# Patient Record
Sex: Male | Born: 1940
Health system: Southern US, Community
[De-identification: ages and names within clinical notes are randomized; demographics above are authoritative.]

## PROBLEM LIST (undated history)

## (undated) DIAGNOSIS — Z972 Presence of dental prosthetic device (complete) (partial): Secondary | ICD-10-CM

## (undated) DIAGNOSIS — E7800 Pure hypercholesterolemia, unspecified: Secondary | ICD-10-CM

## (undated) DIAGNOSIS — I2510 Atherosclerotic heart disease of native coronary artery without angina pectoris: Secondary | ICD-10-CM

## (undated) DIAGNOSIS — K219 Gastro-esophageal reflux disease without esophagitis: Secondary | ICD-10-CM

## (undated) DIAGNOSIS — T7840XA Allergy, unspecified, initial encounter: Secondary | ICD-10-CM

## (undated) DIAGNOSIS — I10 Essential (primary) hypertension: Secondary | ICD-10-CM

## (undated) DIAGNOSIS — N189 Chronic kidney disease, unspecified: Secondary | ICD-10-CM

## (undated) DIAGNOSIS — M1990 Unspecified osteoarthritis, unspecified site: Secondary | ICD-10-CM

## (undated) DIAGNOSIS — H269 Unspecified cataract: Secondary | ICD-10-CM

## (undated) DIAGNOSIS — I4892 Unspecified atrial flutter: Secondary | ICD-10-CM

## (undated) HISTORY — PX: COLONOSCOPY: SHX174

## (undated) HISTORY — DX: Allergy, unspecified, initial encounter: T78.40XA

## (undated) HISTORY — PX: OTHER SURGICAL HISTORY: SHX169

## (undated) HISTORY — DX: Unspecified osteoarthritis, unspecified site: M19.90

## (undated) HISTORY — PX: CARDIAC SURGERY: SHX584

## (undated) HISTORY — DX: Unspecified cataract: H26.9

## (undated) HISTORY — DX: Atherosclerotic heart disease of native coronary artery without angina pectoris: I25.10

## (undated) HISTORY — DX: Essential (primary) hypertension: I10

## (undated) HISTORY — PX: CATARACT EXTRACTION: SUR2

## (undated) HISTORY — DX: Unspecified atrial flutter: I48.92

## (undated) HISTORY — DX: Pure hypercholesterolemia, unspecified: E78.00

---

## 2005-06-19 ENCOUNTER — Ambulatory Visit (HOSPITAL_COMMUNITY): Admission: RE | Admit: 2005-06-19 | Discharge: 2005-06-19 | Payer: Self-pay | Admitting: Pediatrics

## 2005-07-02 ENCOUNTER — Inpatient Hospital Stay (HOSPITAL_COMMUNITY): Admission: RE | Admit: 2005-07-02 | Discharge: 2005-07-11 | Payer: Self-pay | Admitting: Surgery

## 2005-07-02 HISTORY — PX: CORONARY ARTERY BYPASS GRAFT: SHX141

## 2005-07-09 HISTORY — PX: CARDIOVERSION: SHX1299

## 2005-08-23 ENCOUNTER — Encounter (HOSPITAL_COMMUNITY): Admission: RE | Admit: 2005-08-23 | Discharge: 2005-11-21 | Payer: Self-pay | Admitting: Cardiology

## 2005-11-22 ENCOUNTER — Encounter (HOSPITAL_COMMUNITY): Admission: RE | Admit: 2005-11-22 | Discharge: 2006-02-20 | Payer: Self-pay | Admitting: Cardiology

## 2006-08-21 IMAGING — CR DG CHEST 2V
2 series · 2 of 2 positions shown · non-contrast
Comparison: none

CLINICAL DATA: Coronary artery disease.  Hypertension.   Prior smoker. Pre-op for CABG. 
 CHEST - 2 VIEW:
 The heart size and mediastinal contours are within normal limits.  Both lungs are clear.  The visualized skeletal structures are unremarkable.

[view not recorded (1 of 2)]
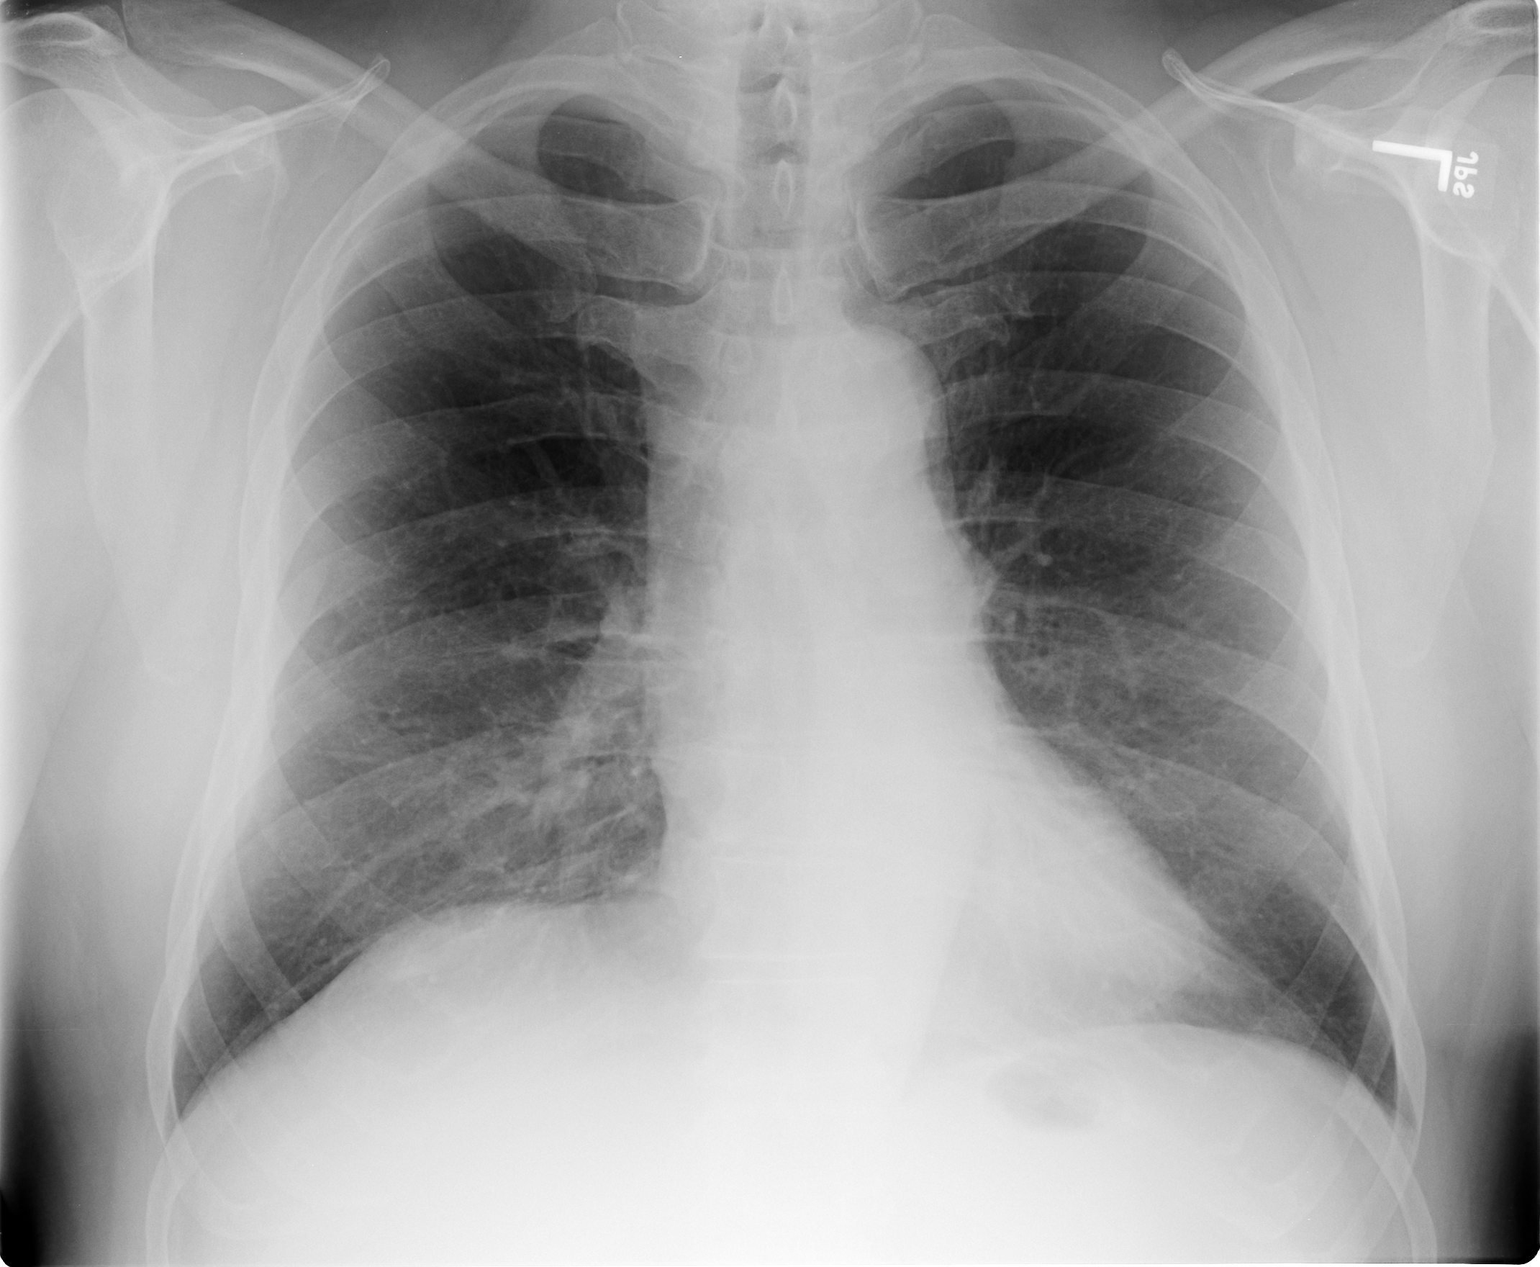

[view not recorded (2 of 2)]
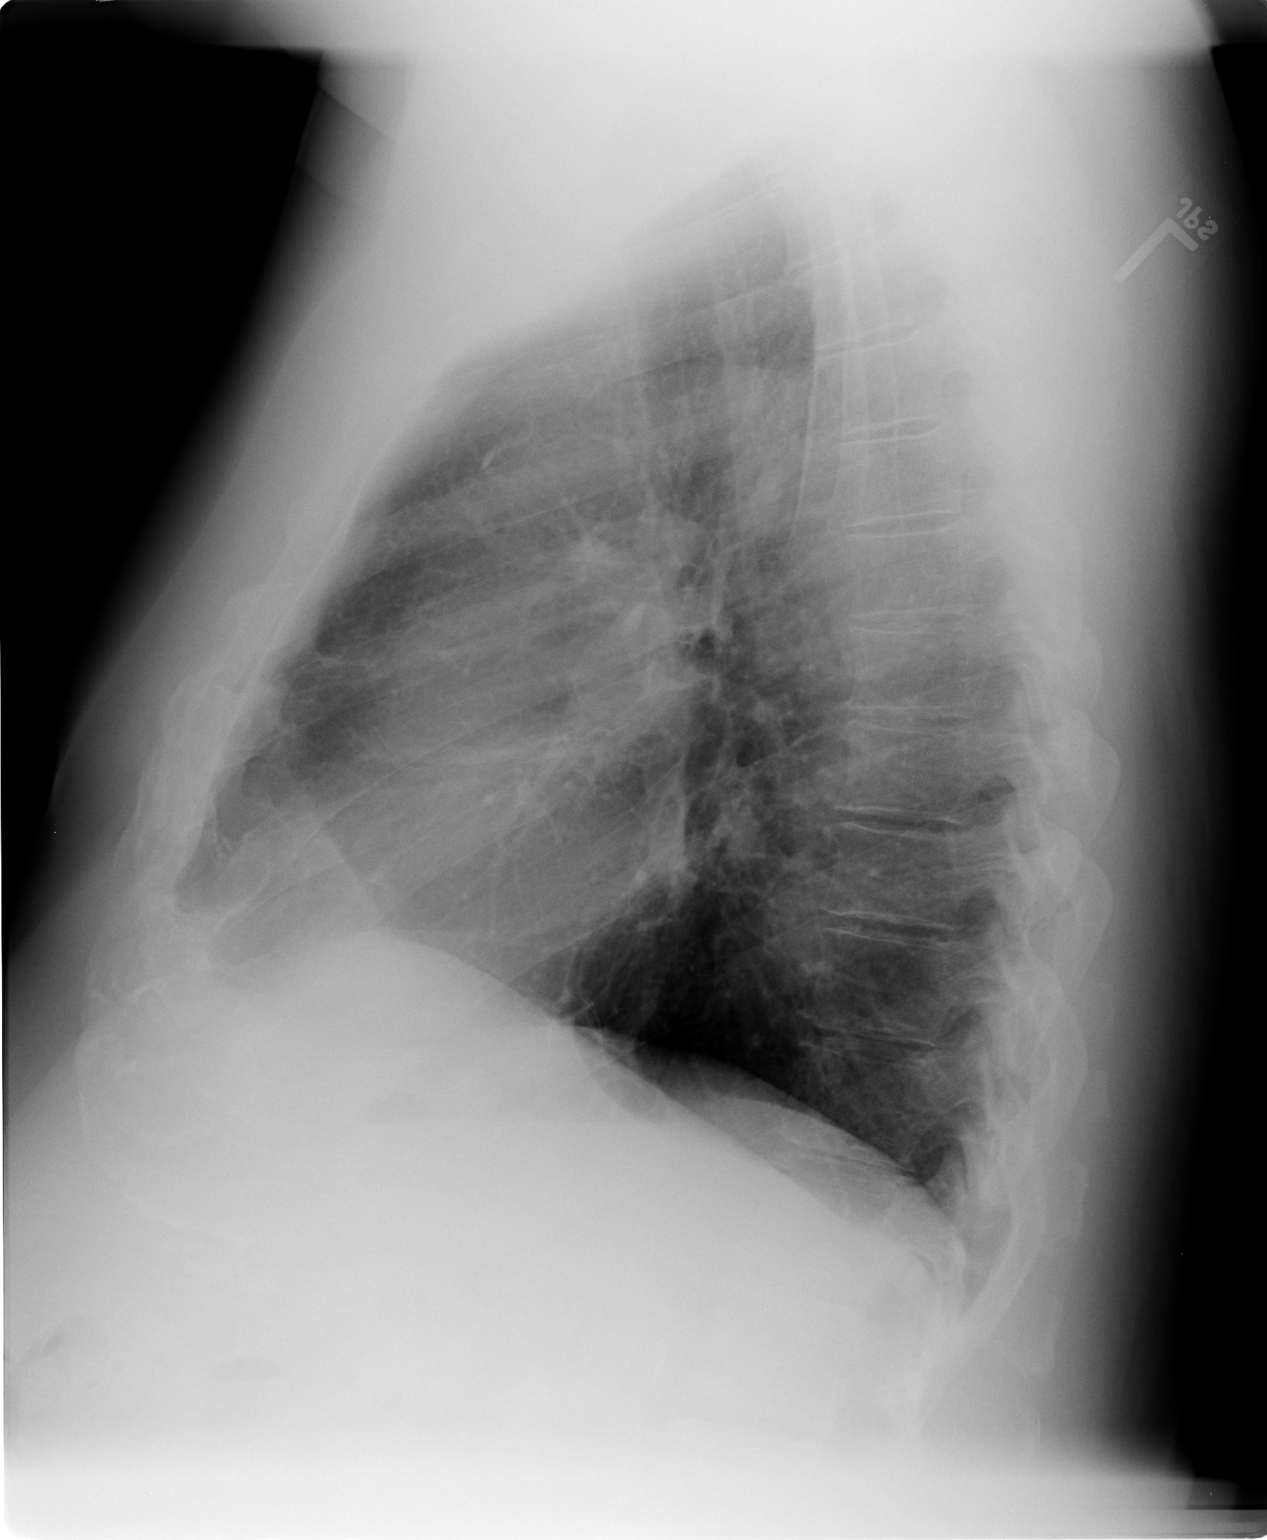

[2 of 2 positions shown; findings below may reference images not displayed]

IMPRESSION: No active cardiopulmonary disease.

## 2006-08-28 IMAGING — CR DG CHEST 2V
2 series · 2 of 2 positions shown · non-contrast
Comparison: none

CLINICAL DATA: Coronary artery disease. Atelectasis left lower lobe. Pain post surgery for coronary artery bypass grafts.
 CHEST- 2 VIEW:

[view not recorded (1 of 2)]
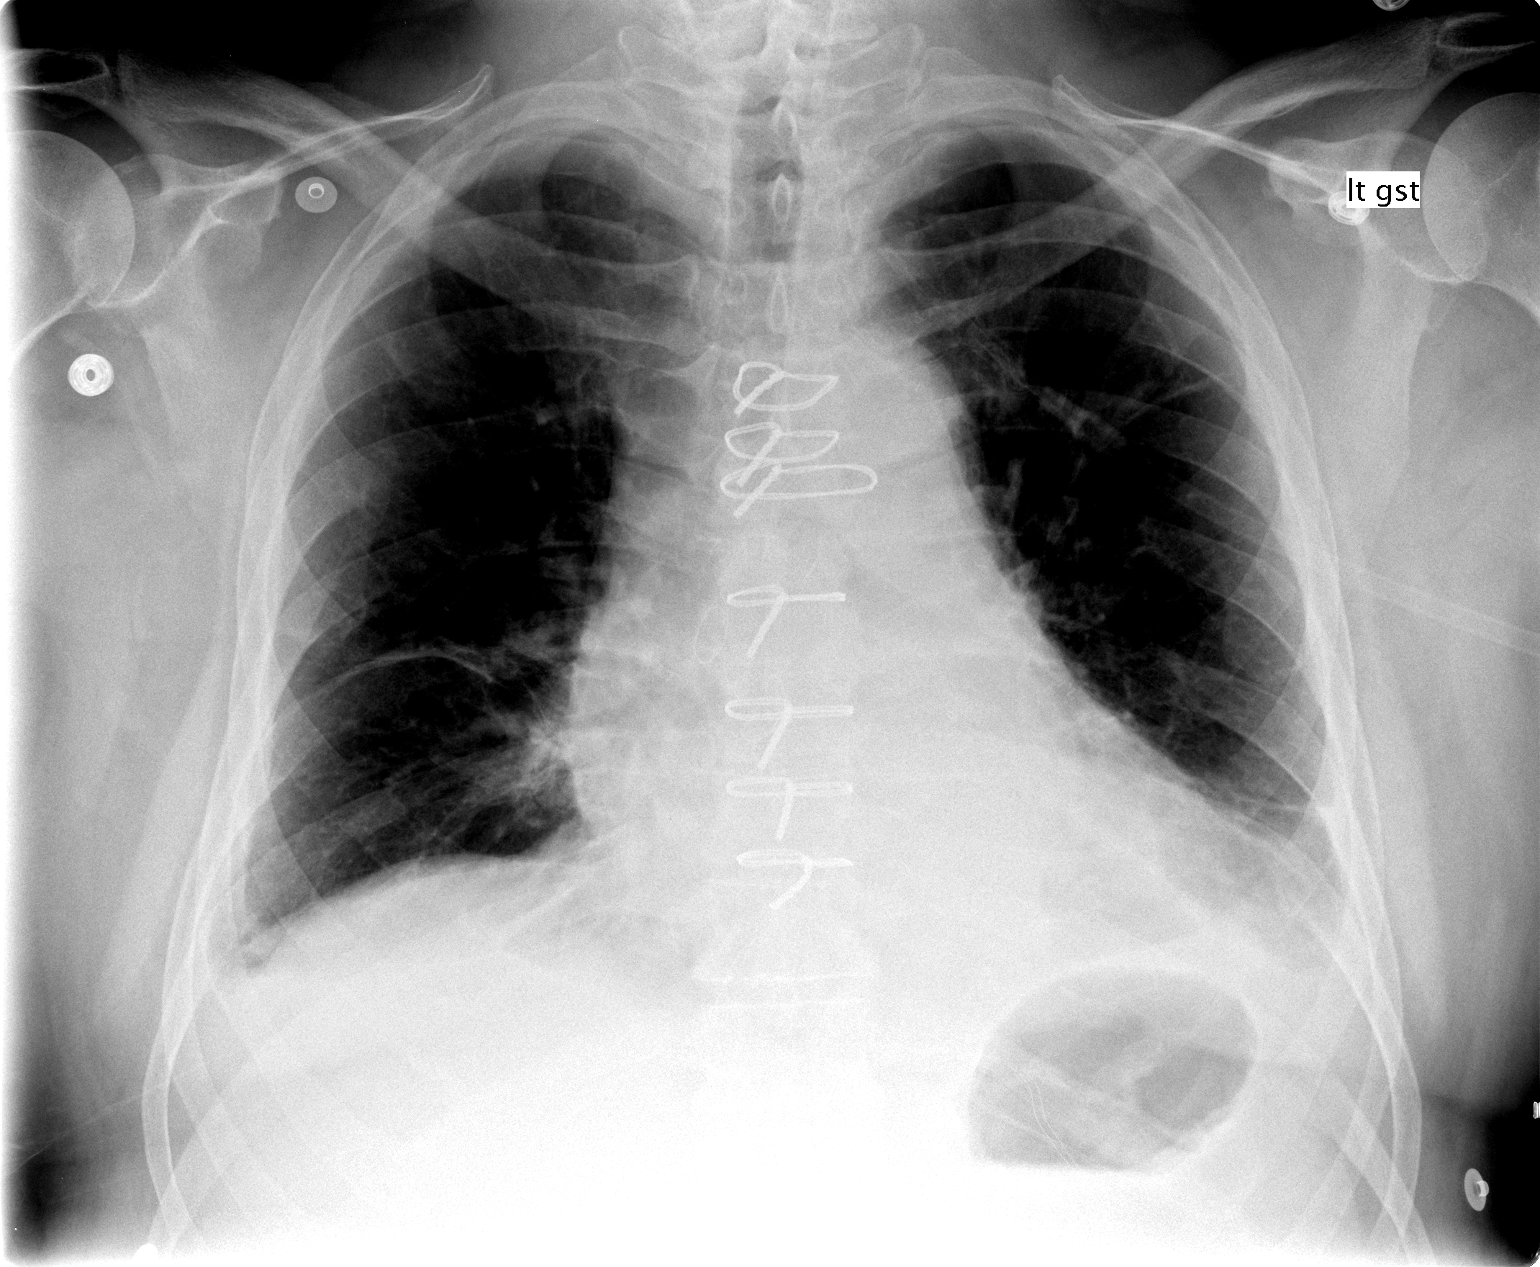

[view not recorded (2 of 2)]
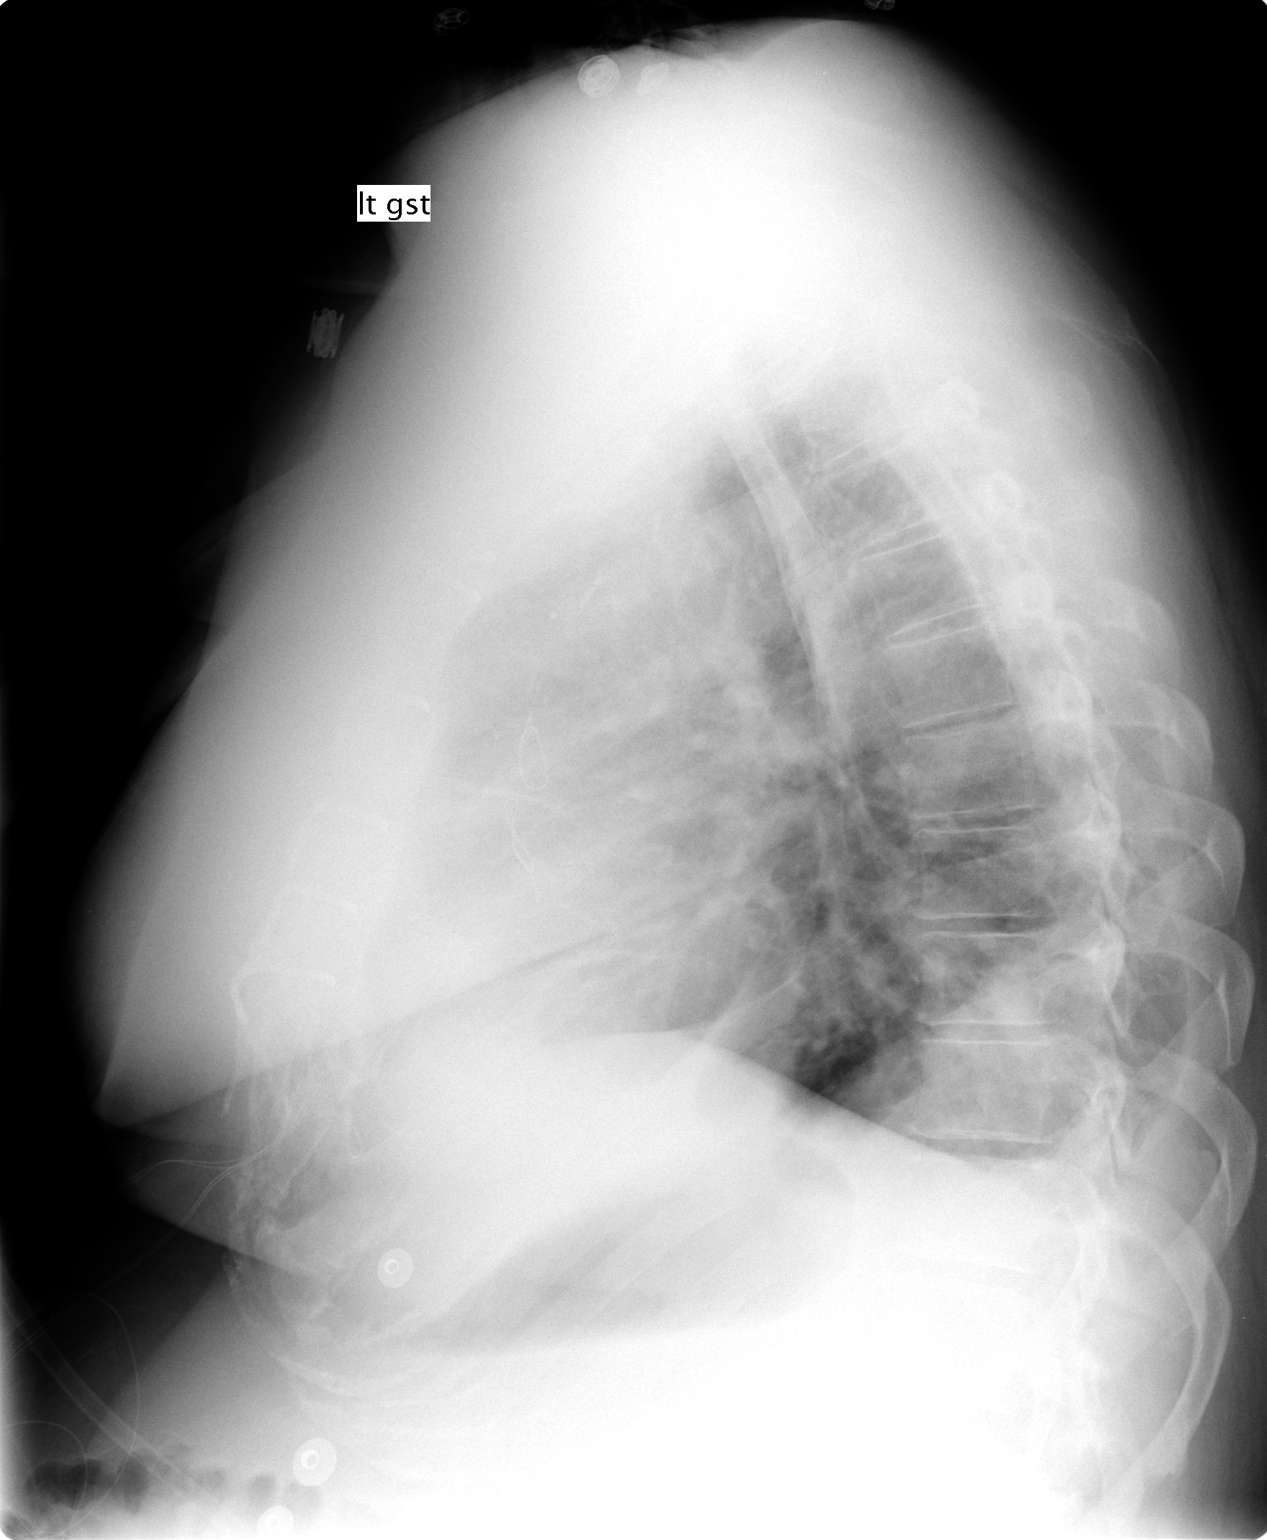

[2 of 2 positions shown; findings below may reference images not displayed]

FINDINGS: PA and lateral views of the chest are made and are compared to the previous study of 07/04/05 and show slight improvement in right lung aeration. There remains some atelectasis at the left base and there is small amount of fluid in both costophrenic angles with somewhat more fluid on the left than the right.
IMPRESSION: Slight improvement in aeration.  There remains some bilateral pleural fluid, more on the left than the right.

## 2007-11-18 ENCOUNTER — Ambulatory Visit: Payer: Self-pay | Admitting: Internal Medicine

## 2007-12-04 ENCOUNTER — Ambulatory Visit: Payer: Self-pay | Admitting: Internal Medicine

## 2007-12-04 ENCOUNTER — Encounter: Payer: Self-pay | Admitting: Internal Medicine

## 2010-01-09 ENCOUNTER — Ambulatory Visit (HOSPITAL_COMMUNITY): Admission: RE | Admit: 2010-01-09 | Discharge: 2010-01-09 | Payer: Self-pay | Admitting: Cardiology

## 2010-05-16 ENCOUNTER — Ambulatory Visit: Payer: Self-pay | Admitting: Cardiology

## 2010-10-28 ENCOUNTER — Encounter: Payer: Self-pay | Admitting: Cardiology

## 2010-10-28 DIAGNOSIS — E119 Type 2 diabetes mellitus without complications: Secondary | ICD-10-CM | POA: Insufficient documentation

## 2010-10-28 DIAGNOSIS — I2510 Atherosclerotic heart disease of native coronary artery without angina pectoris: Secondary | ICD-10-CM

## 2010-10-28 DIAGNOSIS — E7800 Pure hypercholesterolemia, unspecified: Secondary | ICD-10-CM

## 2010-10-28 DIAGNOSIS — I4892 Unspecified atrial flutter: Secondary | ICD-10-CM | POA: Insufficient documentation

## 2010-10-28 DIAGNOSIS — I10 Essential (primary) hypertension: Secondary | ICD-10-CM

## 2010-11-24 ENCOUNTER — Ambulatory Visit (INDEPENDENT_AMBULATORY_CARE_PROVIDER_SITE_OTHER): Payer: Medicare Other | Admitting: Cardiology

## 2010-11-24 DIAGNOSIS — E7800 Pure hypercholesterolemia, unspecified: Secondary | ICD-10-CM

## 2010-11-24 DIAGNOSIS — I4892 Unspecified atrial flutter: Secondary | ICD-10-CM

## 2010-11-24 DIAGNOSIS — I2510 Atherosclerotic heart disease of native coronary artery without angina pectoris: Secondary | ICD-10-CM

## 2010-11-24 DIAGNOSIS — I10 Essential (primary) hypertension: Secondary | ICD-10-CM

## 2010-11-24 DIAGNOSIS — E119 Type 2 diabetes mellitus without complications: Secondary | ICD-10-CM

## 2010-12-12 LAB — GLUCOSE, CAPILLARY: Glucose-Capillary: 168 mg/dL — ABNORMAL HIGH (ref 70–99)

## 2011-02-09 NOTE — H&P (Signed)
NAME:  Jimmy Hughes, Jimmy Hughes NO.:  [id number]   MEDICAL RECORD NO.:  [id number]          PATIENT TYPE:  OIB   LOCATION:                               FACILITY:  [facility]   PHYSICIAN:  Peter M. Swaziland, M.D.  DATE OF BIRTH:  [birth year]   DATE OF ADMISSION:  06/19/2005  DATE OF DISCHARGE:                                HISTORY & PHYSICAL   HISTORY OF PRESENT ILLNESS:  Jimmy Hughes is a 70 year old white male who was  seen recently for a cardiac evaluation.  The patient had an abnormal stress  test in 1998, and subsequently underwent a cardiac catheterization.  At that  time he had total occlusion of the proximal right coronary with collateral  flow.  He had no significant disease in his left coronary distribution and  was treated medically.  His left ventricular function was normal at that  time.  Recently he has complained of no chest pain.  He does note occasional  discomfort in his throat, which he attributes to swallowing difficulty, but  may occur with exertion.  He is fairly sedentary.  He has multiple cardiac  risk factors including a history of hypertension, diabetes and  hypercholesterolemia.  He underwent a recent stress Cardiolite study.  He  had very poor exercise tolerance, walking only three minutes and 45 seconds.  He had marked ST-segment changes in the inferolateral leads up to 5 mm of ST-  segment depression.  He did have chest tightness and throat tightness,  resolved in recovery.  His Cardiolite images demonstrated transient ischemic  dilatation.  He had large inferior lateral wall defects on stress images, as  well as mild distal anterior septal defect which all normalized at rest.  His ejection fraction was depressed at 36%.  Based on his high risk stress  test, it was recommended that he undergo a cardiac catheterization.   PAST MEDICAL HISTORY:  1.  Diabetes mellitus type 2.  2.  Hypercholesterolemia.  3.  Hypertension.  4.  Prior inguinal hernia  repair.   ALLERGIES:  No known drug allergies.   CURRENT MEDICATIONS:  1.  Actos 15 mg daily.  2.  Lipitor 40 mg daily.  3.  Glucovance 500 mg, two tab b.i.d.  4.  Diovan 160 mg daily.  5.  Aspirin 81 mg daily.  6.  Multivitamin daily.   ALLERGIES:  No known drug allergies.   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  He is married and has two children.  He quit smoking over 20 years ago.  He  rarely drinks alcohol.   FAMILY HISTORY:  His father died at age 88, with complications of surgery.  Mother died at age 23, with colon cancer.  She also had heart disease.  One  brother died at age 76 with drowning.   REVIEW OF SYSTEMS:  He has no history of TIA or stroke.  No recent bowel or  bladder complaints.  No claudication.  No edema, orthopnea or PND.  All  other review of systems is negative.   PHYSICAL  EXAMINATION:  GENERAL: The patient is an obese white male, in no  distress.  VITAL SIGNS:  His weight is 251 pounds, pulse 88 and regular, blood pressure  160/92, respirations normal.  HEENT:  Normocephalic and atraumatic.  Pupils equal, round, reactive to  light and accommodation.  Sclerae clear.  Oropharynx clear.  NECK:  Without jugular venous distention or adenopathy, thyromegaly or  bruits.  LUNGS:  Clear.  HEART:  Reveals a regular rate and rhythm without gallop, murmur or click.  ABDOMEN:  Soft, nontender.  No masses or bruits.  EXTREMITIES:  Without edema.  Pulses 2+ and symmetric.  NEUROLOGIC:  Nonfocal.   LABORATORY DATA:  Resting electrocardiogram:  Shows a normal sinus rhythm  with occasional PVC's and left anterior fascicular block.   His coags are normal.  Glucose 171, BUN 18, creatinine 1.3, sodium 141,  potassium 4.8, chloride 101, CO2 of 28.  CBC is normal.   A chest x-ray showed no active disease.   IMPRESSION:  1.  Markedly abnormal stress Cardiolite study with high risk features      including poor exercise  tolerance, marked ST-segment changes, transient      ischemic dilatation, reduced left ventricular function and multiple      perfusion abnormalities.  2.  Coronary artery disease with known chronic occlusion of the right      coronary.  3.  Hypercholesterolemia.  4.  Diabetes mellitus type 2.  5.  Hypertension.   PLAN:  Will proceed with a diagnostic cardiac catheterization for further  therapy, pending these results.           ______________________________  Peter M. Swaziland, M.D.     PMJ/MEDQ  D:  06/15/2005  T:  06/15/2005  Job:  [id number]   cc:   Gaspar Garbe, M.D.  [address]  [place]  [place] [address]  Fax: [phone]

## 2011-02-09 NOTE — Op Note (Signed)
NAME:  Jimmy Hughes, Jimmy Hughes NO.:  [id number]   MEDICAL RECORD NO.:  [id number]           PATIENT TYPE:   LOCATION:                                 FACILITY:   PHYSICIAN:  Peter M. Swaziland, M.D.       DATE OF BIRTH:   DATE OF PROCEDURE:  07/09/2005  DATE OF DISCHARGE:                                 OPERATIVE REPORT   PROCEDURE:  Elective cardioversion procedure.   INDICATION FOR PROCEDURE:  The patient is a 70 year old male who is status  post coronary artery bypass graft who has developed persistent atrial  fibrillation.  This despite IV amiodarone.   PROCEDURE NOTE:  The patient received anesthesia with 200 mg of IV Pentothal  He was given a single synchronized biphasic DC shock at 120 joules with  prompt return to normal sinus rhythm.  There were no complications.   IMPRESSION:  Successful DC cardioversion.           ______________________________  Peter M. Swaziland, M.D.     PMJ/MEDQ  D:  07/09/2005  T:  07/09/2005  Job:  [id number]   cc:   Evelene Croon, M.D.  [address]  [place]  [place] [address]   Gaspar Garbe, M.D.  Fax: [phone]

## 2011-02-09 NOTE — Op Note (Signed)
NAME:  Jimmy Hughes, Jimmy Hughes NO.:  [id number]   MEDICAL RECORD NO.:  [id number]          PATIENT TYPE:  INP   LOCATION:  2313                         FACILITY:  [facility]   PHYSICIAN:  Evelene Croon, M.D.     DATE OF BIRTH:  Sep 17, 1941   DATE OF PROCEDURE:  07/02/2005  DATE OF DISCHARGE:                                 OPERATIVE REPORT   PREOPERATIVE DIAGNOSIS:  Severe three-vessel coronary artery disease.   POSTOPERATIVE DIAGNOSIS:  Severe three-vessel coronary artery disease.   OPERATIVE PROCEDURE:  Median sternotomy, extracorporeal circulation,  coronary artery bypass graft surgery x3 using a left internal mammary artery  graft to the left anterior descending coronary artery, with a saphenous vein  graft to the obtuse marginal branch of the left circumflex coronary artery,  and a saphenous vein graft to the posterolateral branch of the right  coronary artery.  Endoscopic vein harvesting from the right leg.   ATTENDING SURGEON:  Evelene Croon, MD.   ASSISTANT:  Coral Ceo, PA-C.   ANESTHESIA:  General endotracheal.   CLINICAL HISTORY:  This patient is a 70 year old gentleman, who had an  abnormal stress test in 1998 and subsequently underwent cardiac  catheterization at that time showing a total occlusion in the proximal right  coronary artery with collateral flow.  He had no significant disease and the  left coronary circulation was treated medically.  He recently developed  discomfort in his throat, which he attributed to heartburn.  This typically  occurred with exertion.  He underwent a stress Cardiolite exam that showed  poor exercise tolerance and marked ST segment changes in the anterolateral  leads.  He did have some chest tightness and throat tightness that resolved  in recovery.  He had a large inferior wall defect on stress images with mild  distal anteroseptal defect that normalized at rest.  His ejection fraction  was about 36%.  He underwent a cardiac  catheterization on June 19, 2005.  There was about 20% distal left main tapering.  The LAD was a large  vessel that was mildly calcified  and had a 50% proximal stenosis.  The left  circumflex had 90 to 95% ostial stenosis.  The right coronary artery was  occluded proximally with left-to-right collaterals filling the distal  vessel.  Left ventricular ejection fraction was about 55% with minimal  inferior wall hypokinesis.  There was no gradient across the aortic valve.  End diastolic pressure was 28.  After reviewing the angiogram and  examination of the patient, it was felt that coronary artery bypass graft  surgery was the best treatment to prevent further ischemia and infarction.  I discussed the operative procedure with the patient and his wife, including  alternatives, benefits, and risks, including bleeding, blood transfusion,  infection, stroke, myocardial infarction, graft failure, and death.  He  understood and agreed to proceed.   OPERATIVE PROCEDURE:  The patient was taken to the operating room and placed  on the table in the supine position.  After induction of general  endotracheal anesthesia, a Foley catheter was placed in the bladder  as a  sterile technique.  Then the chest, abdomen, and both lower extremities were  prepped and draped in the usual sterile manner.  The chest was entered  through a median sternotomy incision and the pericardium opened in the  midline.  Examination of the heart showed enlargement around the epicardial  fat.  Left ventricular function appeared well preserved.   Then, the left internal mammary artery was harvested from the chest wall as  a pedicle graft.  This was a medium caliber vessel with excellent blood flow  through it.  At the same time, a segment of greater saphenous vein was  harvested from the right leg using endoscopic vein harvest technique.  This  vein was of medium size and good quality.   Then the patient was heparinized,  and when an adequate activated clotting  time was achieved, the distal ascending aorta was cannulated using a 22-  French aortic cannula for arterial inflow.  Venous outflow was achieved  using a two-stage venous cannula for the right atrial appendage.  An  antegrade cardioplegia and vent cannula was inserted in the aortic root.   The patient was placed on cardiopulmonary bypass, and the distal coronary  arteries identified.  The LAD was a moderate-sized, graftable vessel with no  distal disease in it.  The obtuse marginal branch was a moderate-sized  vessel with mild distal disease in it.  It was difficult to expose due to  the large amount of epicardial fat and it was lying in a trough.  The right  coronary artery gave off a small, posterior descending branch, which was not  of graftable size, and a larger posterolateral branch, which was suitable  for grafting.   Then, the aorta was cross-clamped and 500 mL of cold blood antegrade  cardioplegia was administered in the aortic root with quick arrest of the  heart.  Systemic hypothermia to 28 degrees centigrade and topical  hypothermia with iced saline was used.  A temperature probe was in the  septum and an insulating pad in the pericardium.   The first distal anastomosis was performed to the obtuse marginal branch.  The internal diameter of the vessel was about 1.75 mm.  The conduit used was  a segment of greater saphenous vein and the anastomosis performed in an end-  to-side manner using continuous 7-0 Prolene suture.  Flow was measured  through the graft and was excellent.   A second distal anastomosis was performed to the posterolateral branch of  the right coronary artery.  The internal diameter was about 1.75 mm.  The  conduit used was a second segment of greater saphenous vein and the  anastomosis performed in an end-to-side manner using continuous 7-0 Prolene suture.  Flow was measured through the graft and was excellent.   Then,  another dose of cardioplegia was given down the vein grafts and in the  aortic root.   The third distal anastomosis was performed to the distal portion of the left  anterior descending coronary artery.  The internal diameter of this vessel  was about 1.75 mm.  The conduit used was the left internal mammary graft and  this was brought through an opening in the left pericardium anterior to the  phrenic nerve.  It was anastomosed to the LAD in an end-to-side manner using  continuous 8-0 Prolene suture.  The pedicle was sutured to the epicardium  with 6-0 Prolene sutures.  The patient was rewarmed to 37 degrees  centigrade.  The two proximal vein graft anastomoses were performed in the  aortic root in an end-to-side manner using continuous 6-0 Prolene suture.  The clamp was removed from the mammary artery pedicle.  There was rapid  warming of the ventricular septum and return of spontaneous ventricular  fibrillation.  The cross-clamp was removed, the time was 68 minutes, and the  patient spontaneously converted to sinus rhythm.  The proximal and distal  anastomoses appeared hemostatic and allowed to graft satisfactorily.  Graft  markers were placed around the proximal anastomoses.  Two temporary right  ventricular and right atrial pacing wires placed and brought out through the  skin.   When the patient had rewarmed to 37 degrees centigrade, he was weaned from  cardiopulmonary bypass on no inotropic agents.  Total bypass time was 86  minutes.  Cardiac function appeared excellent with a cardiac output of five  liters a minute.  Protamine was given, and the venous and aortic cannulas  were removed without difficulty.  Hemostasis was achieved.  Three chest  tubes were placed with two in the posterior pericardium, one in the left  pleural space, and one in the anterior mediastinum.  The pericardium was  reapproximated over the heart.  The sternum was closed with #6 stainless  steel  wires.  The fascia was closed with a continuous #1 Vicryl suture.  The  subcutaneous tissue was closed with continuous 2-0 Vicryl and the skin with  a 3-0 Vicryl subcuticular closure.  The lower extremity vein harvest site  was closed in layers in a similar manner.  The sponge, needle, and  instrument  counts correct, according to the scrub nurse.  Dry sterile dressings were  applied over the incisions and around the chest tubes, which were hooked to  Pleuravac suction.  The patient remained hemodynamically stable and was  transported to the SICU in guarded but stable condition.      Evelene Croon, M.D.  Electronically Signed     BB/MEDQ  D:  07/02/2005  T:  07/02/2005  Job:  [id number]   cc:   Peter M. Swaziland, M.D.  Fax: [phone]   [facility]

## 2011-02-09 NOTE — Cardiovascular Report (Signed)
NAME:  Jimmy Hughes, Jimmy Hughes NO.:  [id number]   MEDICAL RECORD NO.:  [id number]          PATIENT TYPE:  OIB   LOCATION:  2899                         FACILITY:  [facility]   PHYSICIAN:  Peter M. Swaziland, M.D.  DATE OF BIRTH:  11/30/1940   DATE OF PROCEDURE:  06/19/2005  DATE OF DISCHARGE:                              CARDIAC CATHETERIZATION   INDICATION FOR PROCEDURE:  The patient is a 70 year old white male with  history of diabetes mellitus, hypercholesterolemia and hypertension.  He has  a known history of coronary disease with total occlusion of the right  coronary artery noted by cardiac catheterization in 1998.  On recent  followup stress testing, the patient had a markedly abnormal response  showing severe ST depression associated with symptoms at an early exercise  level.  On Cardiolite images, he had severe ischemia noted in the  inferolateral wall distribution as well as mild distal anteroseptal  ischemia.  He had transient ischemic dilatation and decreased LV function  with ejection fraction of 36%.   PROCEDURES:  1.  Left heart catheterization.  2.  Coronary and left ventricular angiography.   EQUIPMENT:  The 6-French 4-cm right and left Judkins catheters, 6-French  pigtail catheter, 6-French arterial sheath.   MEDICATIONS:  Local anesthesia with 1% Xylocaine.   CONTRAST:  One hundred milliliters of Omnipaque.   HEMODYNAMIC DATA:  Aortic pressure is 159/86 with a mean of 117.  Left  ventricular pressure is 157 with an EDP of 28 mmHg.   ANGIOGRAPHIC DATA:  The left coronary arises and distributes normally.  The  left main coronary has a tapering 20% stenosis distally.   The left anterior descending artery is a very large vessel.  It is  moderately calcified.  It has 50% stenosis in the proximal vessel.   The left circumflex coronary has a 90% to 95% ostial stenosis.  The  remainder of the vessel is without significant disease.   The right coronary is  occluded proximally.  There are excellent left-to-  right collaterals to the right coronary, which appears to be a sizable  vessel.   Left ventricular angiography demonstrates normal left ventricular size with  minimal inferior wall hypokinesia.  Overall ejection fraction is estimated  at 55%.   FINAL INTERPRETATION:  1.  Severe three-vessel obstructive atherosclerotic coronary disease.      Clearly, the ostial circumflex and proximal right coronary lesions are      not amenable to catheter-based intervention.  I would recommend coronary      artery bypass surgery.           ______________________________  Peter M. Swazil[place], M.D.     PMJ/MEDQ  D:  06/19/2005  T:  06/19/2005  Job:  [id number]   cc:   Gaspar Garbe, M.D.  Fax: [phone]   [facility]

## 2011-02-09 NOTE — Consult Note (Signed)
NAME:  Jimmy Hughes, Jimmy Hughes NO.:  [id number]   MEDICAL RECORD NO.:  [id number]          PATIENT TYPE:  INP   LOCATION:  2029                         FACILITY:  [facility]   PHYSICIAN:  Gaspar Garbe, M.D.DATE OF BIRTH:  Nov 22, 1940   DATE OF CONSULTATION:  07/04/2005  DATE OF DISCHARGE:                                   CONSULTATION   Consultation requested by Peter M. Swaziland, M.D., of cardiology.   CHIEF COMPLAINT:  Elevated CBG status post coronary artery bypass graft,  fever.   HISTORY OF PRESENT ILLNESS:  The patient is a 70 year old white male whom I  am well-acquainted with, who is a long-time patient at [facility].  He was seen back in August for a routine visit at our office,  which we once again discussed diabetes and cardiovascular risk, and he was  sent for a subsequent Cardiolite test, which came back positive.  He had  seen Dr. Swaziland and underwent cardiac catheterization revealing the need for  coronary artery bypass grafting due to severe three-vessel disease.  He was  subsequently seen by Dr. Laneta Simmers and has been admitted for that bypass on  July 02, 2005.  Following the bypass he has had elevated CBGs in the 200-  300 range.  He has been off of his oral medications during this time and has  reinstituted his diet.  In addition, he was having fevers earlier today and  we were asked to consult with relation to these.   The patient indicates that he feels reasonably well.  He does have some pain  in his chest, which he relates to the incision area, and his left arm is  somewhat sore when he moves it.  Otherwise, he feels reasonably well and is  attended by his wife and his daughter, who are at his bedside.  He indicates  that he had some fevers earlier but feels that they have broken at this  point.   ALLERGIES:  No known drug allergies.   Medications prior to admission were Glucovance, Diovan, Lipitor, aspirin,  multivitamins and  Actos.  He is currently receiving:   1.  Lantus 45 units subcu q.h.s. with a sliding scale of Humalog.  2.  Colace.  3.  Dulcolax.  4.  Aspirin 325 mg.  5.  Lasix 40 mg p.o. daily.  6.  Potassium 40 mEq p.o. daily.  7.  Metoprolol 25 mg b.i.d.  8.  Zocor 40 mg daily.  9.  Multiple p.r.n.'s, including oxycodone, Zofran and Phenergan.   PAST MEDICAL HISTORY:  1.  Diabetes mellitus with microalbuminuria.  The patient has an A1c in the      7 range and has been resistant to institution of insulin prior.  2.  Hypertension.  3.  Hyperlipidemia.  4.  Erectile dysfunction.   SOCIAL HISTORY:  The patient lives in [place] with his wife.  He works in  Education officer, museum. He quit smoking back in the 1980s and smoked approximately  20 pack years.  He drinks occasional alcohol.  He is married with two  children.  FAMILY HISTORY:  Mother died at age 71 of colon cancer.  Father died at age  24 of complications of diverticulitis.  He has one brother who died suddenly  at age 38.   REVIEW OF SYSTEMS:  The patient has fevers as above.  He denies any  headache, sore throat or other respiratory symptoms.  It does hurt to cough  slightly but denies any other chest pain or shortness of breath.  He denies  any urinary symptoms.  He denies any GI symptoms and is, in fact, eating  quite well.   ADVANCED DIRECTIVES:  The patient is a full code.   PHYSICAL EXAMINATION:  VITAL SIGNS:  Temperature of 101.3, pulse 101,  respiratory rate 20, blood pressure 124/71, saturating 94% on 2 L.  GENERAL:  No acute distress.  HEENT:  Normocephalic, atraumatic.  PERRLA, EOMI.  ENT is within normal  limits.  NECK:  Supple.  No lymphadenopathy, JVD or bruit.  CARDIAC:  Regular rate and rhythm, no murmur, rub or gallop are appreciated.  CHEST:  Lungs are clear to auscultation in the upper fields.  He has slight  crackles in the bases.  SKIN:  The patient has a very clean-appearing midline incision, status post   coronary artery bypass graft.  ABDOMEN:  Soft, nontender, normoactive bowel sounds.  EXTREMITIES:  No clubbing, cyanosis, or edema are noted.  MUSCULOSKELETAL:  The patient has some pain with movement of his left  shoulder, but there was no joint effusion present.  NEUROLOGIC:  Oriented x3.  Cranial nerves II-XII are intact with 5/5  strength bilaterally.   Chest x-ray reviewed shows from yesterday left lower lobe atelectasis versus  dense opacity, which has worsened in that past day.  Review of today's film  is pending.  Labs:  White count 9.5, hemoglobin 8.6, hematocrit 25.0,  platelets 115.  BUN and creatinine are 22 and 1.6, respectively.  CBGs over  the past 24 hours are 327, 308, 310 and 240.   ASSESSMENT AND PLAN:  1.  Diabetes mellitus type 2.  I have added 10 units of NPH this morning at      the time of talking with Dr. Swaziland.  The patient is ready to eat lunch.      At this point I have increased his sliding scale liberally.  He is      getting 45 units of Lantus and we will increase this up to 55 units.  In      addition, I will re-added his sulfonylurea as Amaryl 8 mg a day.  Will      continue to hold his Glucophage due to his creatinine being higher than      can allow for that medication.  I have indicated to him that he will      most likely require this to go home and will have diabetic teaching see      him with relation to this so that they can instruct him on how to give      his sliding scale and give Lantus.  At that point we will most likely      return him to his three oral drug regimen.  The patient still wants to      do things with diet and exercise, but I have noted to him that it is      very important that we get his blood sugars under optimal control,      especially considering his recent coronary  artery disease and      intervention.  2.  Fevers.  Will check blood cultures x2 and a UA.  However, his chest x-     ray, I feel, is most likely over the  next day going to show more      evidence of a left lower lobe infiltrate.  Just being postsurgical, I      started the patient on Unasyn by IV and will convert him over to      Augmentin once his fevers are stabilized.  3.  Hypertension.  Currently controlled.  4.  Hyperlipidemia.  I have taken the liberty of increasing his Lipitor up      to 80 mg a day as his LDL goal guidelines will be most likely tighter      and also based on reversal-type data with Lipitor.  Will check this at a      later date and may need to add Zetia to his regimen in order to get him      under an LDL of 70.  5.  Anemia.  Will continue to watch this and provide iron supplementation so      that he can build his blood counts back.      Gaspar Garbe, M.D.  Electronically Signed     RWT/MEDQ  D:  07/04/2005  T:  07/04/2005  Job:  [id number]

## 2011-02-09 NOTE — Discharge Summary (Signed)
NAME:  NAKOTA, ELSEN NO.:  [id number]   MEDICAL RECORD NO.:  [id number]          PATIENT TYPE:  INP   LOCATION:  2029                         FACILITY:  [facility]   PHYSICIAN:  Evelene Croon, M.D.     DATE OF BIRTH:  07-11-1941   DATE OF ADMISSION:  07/02/2005  DATE OF DISCHARGE:                                 DISCHARGE SUMMARY   DATE OF ANTICIPATED DISCHARGE:  July 11, 2005.   PRIMARY ADMITTING DIAGNOSIS:  Coronary artery disease.   ADDITIONAL/DISCHARGE DIAGNOSES:  1.  Severe three-vessel coronary artery disease.  2.  Type 2 diabetes mellitus.  3.  Hypertension.  4.  Hyperlipidemia.  5.  Postoperative anemia.  6.  Postoperative fever probably secondary to atelectasis.  7.  Postoperative atrial fibrillation/atrial flutter.   PROCEDURES PERFORMED:  1.  Coronary artery bypass grafting x3 (left internal mammary artery to the      LAD, saphenous vein graft to the obtuse marginal, saphenous vein graft      to the P L).  2.  Endoscopic vein harvest, right leg.  3.  Direct current cardioversion.   HISTORY:  The patient is a 70 year old white male with a known history of  coronary artery disease which been treated medically since 1998.  Please see  history and physical examination for further details.  He has recently  developed symptoms of throat discomfort which occur with exertion and  underwent a stress Cardiolite exam which was positive.  He underwent cardiac  catheterization, on June 19, 2005, and was found to have severe three-  vessel coronary artery disease with a left ventricular ejection fraction of  55%.  He was referred to Dr. Evelene Croon for further evaluation and  treatment.  Dr. Laneta Simmers felt that the patient would benefit from surgical  revascularization at this time.  He explained the risks, benefits, and  alternatives of the procedure to the patient and he did agree to proceed  with surgery.   HOSPITAL COURSE:  The patient was admitted  to [facility], on  July 02, 2005, and was taken to the operating room where he underwent CABG  x3 as described in detail above.  He tolerated the procedure well and was  transferred to the SICU in stable condition.  He was able to be extubated  shortly after surgery.  He was hemodynamically stable and doing well on post-  op day #1.  At that time, he was able to be transferred to the floor.  His  primary care physician Dr. Wylene Simmer was involved in his postoperative care,  particularly the management of his diabetes.  His blood sugars have been  somewhat erratic, and his medications have been adjusted accordingly.  Currently, he is on a regimen of oral agents in combination with insulin and  blood sugars have been fairly stable, running somewhere in the 140-170  range.  From a cardiac standpoint, he developed atrial fibrillation on post-  op day #4 and was initially treated with IV amiodarone.  He continued to  have persistent atrial fibrillation and atrial flutter with rates in the  130s to 140s requiring addition of a Cardizem drip to his regimen.  He was  seen by Dr. Swaziland and his beta-blocker dose was also increased.  Despite  all these measures, he continued to have episodes of atrial fibrillation  with persistent atrial flutter.  An attempt was made to rapid atrially pace  him and his rhythm was resistant to this measure as well.  Because he  continued to have atrial fibrillation and atrial flutter with rapid rate, it  was felt he should undergo a direct current cardioversion, this was  performed on July 09, 2005.  The patient was converted to normal sinus  rhythm with a single synchronized biphasic direct current shock at 120  joules.  At that point, his IV Cardizem and IV amiodarone were discontinued,  and his amiodarone dose was switched to p.o.  He was also started on  anticoagulation with Coumadin.  At present now, he has been maintained in a  normal sinus rhythm for  over 24 hours and is doing very well.  His blood  pressures have remained stable.  Otherwise, he has done well  postoperatively.  He has been mildly anemic and has been started on iron  supplementation.  He also had fevers initially in his postoperative period  between 100 and 101.  He was started on IV antibiotics, pending cultures.  Blood cultures and urinalysis were all negative.  His white count remained  stable.  He had no evidence on physical exam of acute infection.  He did,  however, on chest x-ray have evidence of bibasilar atelectasis.  He was  treated with aggressive pulmonary toilet measures, and he was switched to  p.o. antibiotic.  He has had no further episodes of fever and has remained  stable otherwise.  He has been diuresed back down to 2 pounds above his  preoperative weight.  His surgical incision sites are all healing well.  He  is ambulating in the halls without difficulty.  His most recent labs show a PT of 16.1, an INR of 1.3.  Sodium 135,  potassium 4.3, BUN 14, creatinine 1.3.  White count 9.3, hemoglobin 9.0,  hematocrit 26.2, platelets 222.  It is felt that if he remains stable over the next 24 hours and his INR  continues to trend upward, he will be ready for discharge home hopefully on  July 11, 2005.   Discharge medications are as follows:  1.  Enteric-coated aspirin 325 mg q.d.  2.  Coumadin home dose will be determined by PT and INR drawn on the day of      discharge.  3.  Lopressor 50 mg b.i.d.  4.  Lipitor 80 mg every day.  5.  Amiodarone 400 mg b.i.d.  6.  Lantus 55 units q.h.s.  7.  Actos 45 mg every day.  8.  Amaryl 8 mg every day.  9.  Sliding scale insulin as directed.  10. Ferrous sulfate 325 mg t.i.d. with food.  11. Folic acid 1 mg every day.  12. Tylox, one to two, q.4h. p.r.n. for pain.   DISCHARGE INSTRUCTIONS: 1.  He is asked to refrain from driving, heavy lifting or strenuous      activity.  2.  He may continue ambulating daily  and using his incentive spirometer.  3.  He may shower daily and clean his incisions with soap and water.   DISCHARGE FOLLOWUP:  1.  He will see Dr. Swaziland back in the office in 2 weeks and is  asked to      call schedule this appointment.  2.  He will follow up Dr. Laneta Simmers on July 31, 2005 at 12:45 p.m.  3.  He is asked to have PT and INR drawn within 48 hours of discharge at Dr.      Elvis Coil office.  4.  He will make an appointment see Dr. Wylene Simmer in 1-2 weeks as directed.  5.  If he experiences any problems or has questions upon discharge, he is      asked to contact our office immediately.      Coral Ceo, P.A.      Evelene Croon, M.D.  Electronically Signed    GC/MEDQ  D:  07/10/2005  T:  07/10/2005  Job:  [id number]   cc:   Gaspar Garbe, M.D.  Fax: [phone]   Peter M. Swaziland, M.D.  Fax: [phone]

## 2011-03-07 ENCOUNTER — Encounter: Payer: Self-pay | Admitting: Cardiology

## 2011-08-21 ENCOUNTER — Encounter: Payer: Self-pay | Admitting: Cardiology

## 2012-12-10 ENCOUNTER — Encounter: Payer: Self-pay | Admitting: Internal Medicine

## 2013-01-13 ENCOUNTER — Encounter: Payer: Self-pay | Admitting: Cardiology

## 2013-08-07 ENCOUNTER — Encounter: Payer: Self-pay | Admitting: Internal Medicine

## 2014-05-18 ENCOUNTER — Encounter: Payer: Self-pay | Admitting: Internal Medicine

## 2014-08-10 ENCOUNTER — Encounter: Payer: Self-pay | Admitting: Internal Medicine

## 2014-09-22 ENCOUNTER — Ambulatory Visit (AMBULATORY_SURGERY_CENTER): Payer: Self-pay | Admitting: *Deleted

## 2014-09-22 VITALS — Ht 73.0 in | Wt 246.0 lb

## 2014-09-22 DIAGNOSIS — Z86010 Personal history of colonic polyps: Secondary | ICD-10-CM

## 2014-09-22 MED ORDER — MOVIPREP 100 G PO SOLR: 1.0000 | Freq: Once | ORAL | Status: DC

## 2014-09-22 NOTE — Progress Notes (Signed)
No egg or soy allergy. No anesthesia problems.  No diet meds.  No home O2.  Pt refused emmi.

## 2014-10-07 ENCOUNTER — Encounter: Payer: Medicare Other | Admitting: Internal Medicine

## 2014-10-12 ENCOUNTER — Encounter: Payer: Self-pay | Admitting: Internal Medicine

## 2014-10-21 ENCOUNTER — Ambulatory Visit (AMBULATORY_SURGERY_CENTER): Payer: Commercial Managed Care - HMO | Admitting: Internal Medicine

## 2014-10-21 ENCOUNTER — Encounter: Payer: Self-pay | Admitting: Internal Medicine

## 2014-10-21 VITALS — BP 151/83 | HR 55 | Temp 96.3°F | Resp 19 | Ht 73.0 in | Wt 246.0 lb

## 2014-10-21 DIAGNOSIS — Z86010 Personal history of colonic polyps: Secondary | ICD-10-CM

## 2014-10-21 DIAGNOSIS — D122 Benign neoplasm of ascending colon: Secondary | ICD-10-CM

## 2014-10-21 LAB — GLUCOSE, CAPILLARY
Glucose-Capillary: 171 mg/dL — ABNORMAL HIGH (ref 70–99)
Glucose-Capillary: 147 mg/dL — ABNORMAL HIGH (ref 70–99)

## 2014-10-21 MED ORDER — SODIUM CHLORIDE 0.9 % IV SOLN: 500.0000 mL | INTRAVENOUS | Status: DC

## 2014-10-21 NOTE — Patient Instructions (Signed)
Discharge instructions given. Handouts on polyps and diverticulosis. Resume previous medications. Your may notice some irritation in your nose or some drainage.  This may cause feelings of congestion.  This is from the oxygen, which can be very drying.  There is no need for concern, this should clear up in a day or so YOU HAD AN ENDOSCOPIC PROCEDURE TODAY AT [facility]: Refer to the procedure report that was given to you for any specific questions about what was found during the examination.  If the procedure report does not answer your questions, please call your gastroenterologist to clarify.  If you requested that your care partner not be given the details of your procedure findings, then the procedure report has been included in a sealed envelope for you to review at your convenience later.  YOU SHOULD EXPECT: Some feelings of bloating in the abdomen. Passage of more gas than usual.  Walking can help get rid of the air that was put into your GI tract during the procedure and reduce the bloating. If you had a lower endoscopy (such as a colonoscopy or flexible sigmoidoscopy) you may notice spotting of blood in your stool or on the toilet paper. If you underwent a bowel prep for your procedure, then you may not have a normal bowel movement for a few days.  DIET: Your first meal following the procedure should be a light meal and then it is ok to progress to your normal diet.  A half-sandwich or bowl of soup is an example of a good first meal.  Heavy or fried foods are harder to digest and may make you feel nauseous or bloated.  Likewise meals heavy in dairy and vegetables can cause extra gas to form and this can also increase the bloating.  Drink plenty of fluids but you should avoid alcoholic beverages for 24 hours.  ACTIVITY: Your care partner should take you home directly after the procedure.  You should plan to take it easy, moving slowly for the rest of the day.  You can resume  normal activity the day after the procedure however you should NOT DRIVE or use heavy machinery for 24 hours (because of the sedation medicines used during the test).    SYMPTOMS TO REPORT IMMEDIATELY: A gastroenterologist can be reached at any hour.  During normal business hours, 8:30 AM to 5:00 PM Monday through Friday, call [phone].  After hours and on weekends, please call the GI answering service at [phone] who will take a message and have the physician on call contact you.   Following lower endoscopy (colonoscopy or flexible sigmoidoscopy):  Excessive amounts of blood in the stool  Significant tenderness or worsening of abdominal pains  Swelling of the abdomen that is new, acute  Fever of 100F or higher  FOLLOW UP: If any biopsies were taken you will be contacted by phone or by letter within the next 1-3 weeks.  Call your gastroenterologist if you have not heard about the biopsies in 3 weeks.  Our staff will call the home number listed on your records the next business day following your procedure to check on you and address any questions or concerns that you may have at that time regarding the information given to you following your procedure. This is a courtesy call and so if there is no answer at the home number and we have not heard from you through the emergency physician on call, we will assume that you have returned to  your regular daily activities without incident.  SIGNATURES/CONFIDENTIALITY: You and/or your care partner have signed paperwork which will be entered into your electronic medical record.  These signatures attest to the fact that that the information above on your After Visit Summary has been reviewed and is understood.  Full responsibility of the confidentiality of this discharge information lies with you and/or your care-partner. 

## 2014-10-21 NOTE — Op Note (Signed)
[place]  [address] [place], [id number]   COLONOSCOPY PROCEDURE REPORT  PATIENT: Jimmy Hughes, Jimmy Hughes  MR#: [id number] BIRTHDATE: [birth year] , 59  yrs. old GENDER: male ENDOSCOPIST: Eustace Quail, MD REFERRED [id number] Program Recall PROCEDURE DATE:  10/21/2014 PROCEDURE:   Colonoscopy with snare polypectomy x1 First Screening Colonoscopy - Avg.  risk and is 50 yrs.  old or older - No.  Prior Negative Screening - Now for repeat screening. N/A  History of Adenoma - Now for follow-up colonoscopy & has been > or = to 3 yrs.  Yes hx of adenoma.  Has been 3 or more years since last colonoscopy.  Polyps Removed Today? Yes. ASA CLASS:   Class II INDICATIONS:surveillance colonoscopy based on a history of adenomatous colonic polyp(s). Index examination March 2009 with small adenomas. MEDICATIONS: Monitored anesthesia care and Propofol 300 mg IV  DESCRIPTION OF PROCEDURE:   After the risks benefits and alternatives of the procedure were thoroughly explained, informed consent was obtained.  The digital rectal exam revealed no abnormalities of the rectum.   The LB [id number]  endoscope was introduced through the anus and advanced to the cecum, which was identified by both the appendix and ileocecal valve. No adverse events experienced.   The quality of the prep was excellent, using MoviPrep  The instrument was then slowly withdrawn as the colon was fully examined.   COLON FINDINGS: A single benign adenomatous-appearing polyp measuring 5 mm in size was found in the ascending colon.  A polypectomy was performed with a cold snare.  The resection was complete. No tissue retrieved for pathologic submission.   There was severe diverticulosis noted throughout the colon.   The examination was otherwise normal.  Retroflexed views revealed internal hemorrhoids. The time to cecum=2 minutes 20 seconds. Withdrawal time=10 minutes 44 seconds.  The scope was withdrawn  and the procedure completed. COMPLICATIONS: There were no immediate complications.  ENDOSCOPIC IMPRESSION: 1.   Single polyp measuring 5 mm in size was found in the ascending colon; polypectomy was performed with a cold snare 2.   Severe diverticulosis was noted throughout the colon 3.   The examination was otherwise normal  RECOMMENDATIONS: 1. Return to the care of your primary provider.  GI follow up as needed  eSigned:  Eustace Quail, MD 10/21/2014 10:10 AM   cc: The Patient and Domenick Gong, MD

## 2014-10-21 NOTE — Progress Notes (Signed)
Called to room to assist during endoscopic procedure.  Patient ID and intended procedure confirmed with present staff. Received instructions for my participation in the procedure from the performing physician.  

## 2014-10-21 NOTE — Progress Notes (Signed)
Patient awakening,vss,report to rn 

## 2014-10-22 ENCOUNTER — Telehealth: Payer: Self-pay | Admitting: *Deleted

## 2014-10-22 NOTE — Telephone Encounter (Signed)
  Follow up Call-  Call back number 10/21/2014  Post procedure Call Back phone  # [phone] or cell [phone]  Permission to leave phone message Yes     Patient questions:  Do you have a fever, pain , or abdominal swelling? No. Pain Score  0 *  Have you tolerated food without any problems? Yes.    Have you been able to return to your normal activities? Yes.    Do you have any questions about your discharge instructions: Diet   No. Medications  No. Follow up visit  No.  Do you have questions or concerns about your Care? No.  Actions: * If pain score is 4 or above: No action needed, pain <4.

## 2016-10-03 DIAGNOSIS — Z85828 Personal history of other malignant neoplasm of skin: Secondary | ICD-10-CM | POA: Diagnosis not present

## 2016-10-03 DIAGNOSIS — L308 Other specified dermatitis: Secondary | ICD-10-CM | POA: Diagnosis not present

## 2016-10-03 DIAGNOSIS — L853 Xerosis cutis: Secondary | ICD-10-CM | POA: Diagnosis not present

## 2016-10-03 DIAGNOSIS — L578 Other skin changes due to chronic exposure to nonionizing radiation: Secondary | ICD-10-CM | POA: Diagnosis not present

## 2016-10-03 DIAGNOSIS — L570 Actinic keratosis: Secondary | ICD-10-CM | POA: Diagnosis not present

## 2016-10-03 DIAGNOSIS — D225 Melanocytic nevi of trunk: Secondary | ICD-10-CM | POA: Diagnosis not present

## 2016-10-03 DIAGNOSIS — L821 Other seborrheic keratosis: Secondary | ICD-10-CM | POA: Diagnosis not present

## 2017-03-13 DIAGNOSIS — E668 Other obesity: Secondary | ICD-10-CM | POA: Diagnosis not present

## 2017-03-13 DIAGNOSIS — I2510 Atherosclerotic heart disease of native coronary artery without angina pectoris: Secondary | ICD-10-CM | POA: Diagnosis not present

## 2017-03-13 DIAGNOSIS — E7800 Pure hypercholesterolemia, unspecified: Secondary | ICD-10-CM | POA: Diagnosis not present

## 2017-03-13 DIAGNOSIS — Z6831 Body mass index (BMI) 31.0-31.9, adult: Secondary | ICD-10-CM | POA: Diagnosis not present

## 2017-03-13 DIAGNOSIS — D692 Other nonthrombocytopenic purpura: Secondary | ICD-10-CM | POA: Diagnosis not present

## 2017-03-13 DIAGNOSIS — Z955 Presence of coronary angioplasty implant and graft: Secondary | ICD-10-CM | POA: Diagnosis not present

## 2017-03-13 DIAGNOSIS — R808 Other proteinuria: Secondary | ICD-10-CM | POA: Diagnosis not present

## 2017-03-13 DIAGNOSIS — N183 Chronic kidney disease, stage 3 (moderate): Secondary | ICD-10-CM | POA: Diagnosis not present

## 2017-03-13 DIAGNOSIS — E1151 Type 2 diabetes mellitus with diabetic peripheral angiopathy without gangrene: Secondary | ICD-10-CM | POA: Diagnosis not present

## 2017-03-13 DIAGNOSIS — I208 Other forms of angina pectoris: Secondary | ICD-10-CM | POA: Diagnosis not present

## 2017-03-13 DIAGNOSIS — Z794 Long term (current) use of insulin: Secondary | ICD-10-CM | POA: Diagnosis not present

## 2017-03-13 DIAGNOSIS — I1310 Hypertensive heart and chronic kidney disease without heart failure, with stage 1 through stage 4 chronic kidney disease, or unspecified chronic kidney disease: Secondary | ICD-10-CM | POA: Diagnosis not present

## 2017-06-25 DIAGNOSIS — E119 Type 2 diabetes mellitus without complications: Secondary | ICD-10-CM | POA: Diagnosis not present

## 2017-06-25 DIAGNOSIS — Z961 Presence of intraocular lens: Secondary | ICD-10-CM | POA: Diagnosis not present

## 2017-08-21 DIAGNOSIS — N183 Chronic kidney disease, stage 3 (moderate): Secondary | ICD-10-CM | POA: Diagnosis not present

## 2017-08-21 DIAGNOSIS — E1151 Type 2 diabetes mellitus with diabetic peripheral angiopathy without gangrene: Secondary | ICD-10-CM | POA: Diagnosis not present

## 2017-08-21 DIAGNOSIS — R82998 Other abnormal findings in urine: Secondary | ICD-10-CM | POA: Diagnosis not present

## 2017-08-21 DIAGNOSIS — Z125 Encounter for screening for malignant neoplasm of prostate: Secondary | ICD-10-CM | POA: Diagnosis not present

## 2017-08-21 DIAGNOSIS — E7800 Pure hypercholesterolemia, unspecified: Secondary | ICD-10-CM | POA: Diagnosis not present

## 2017-08-28 DIAGNOSIS — N183 Chronic kidney disease, stage 3 (moderate): Secondary | ICD-10-CM | POA: Diagnosis not present

## 2017-08-28 DIAGNOSIS — Z955 Presence of coronary angioplasty implant and graft: Secondary | ICD-10-CM | POA: Diagnosis not present

## 2017-08-28 DIAGNOSIS — Z1389 Encounter for screening for other disorder: Secondary | ICD-10-CM | POA: Diagnosis not present

## 2017-08-28 DIAGNOSIS — E1151 Type 2 diabetes mellitus with diabetic peripheral angiopathy without gangrene: Secondary | ICD-10-CM | POA: Diagnosis not present

## 2017-08-28 DIAGNOSIS — Z0000 Encounter for general adult medical examination without abnormal findings: Secondary | ICD-10-CM | POA: Diagnosis not present

## 2017-08-28 DIAGNOSIS — I2510 Atherosclerotic heart disease of native coronary artery without angina pectoris: Secondary | ICD-10-CM | POA: Diagnosis not present

## 2017-08-28 DIAGNOSIS — N528 Other male erectile dysfunction: Secondary | ICD-10-CM | POA: Diagnosis not present

## 2017-08-28 DIAGNOSIS — I1310 Hypertensive heart and chronic kidney disease without heart failure, with stage 1 through stage 4 chronic kidney disease, or unspecified chronic kidney disease: Secondary | ICD-10-CM | POA: Diagnosis not present

## 2017-08-28 DIAGNOSIS — I208 Other forms of angina pectoris: Secondary | ICD-10-CM | POA: Diagnosis not present

## 2017-08-28 DIAGNOSIS — Z6832 Body mass index (BMI) 32.0-32.9, adult: Secondary | ICD-10-CM | POA: Diagnosis not present

## 2017-08-28 DIAGNOSIS — Z794 Long term (current) use of insulin: Secondary | ICD-10-CM | POA: Diagnosis not present

## 2017-08-28 DIAGNOSIS — D692 Other nonthrombocytopenic purpura: Secondary | ICD-10-CM | POA: Diagnosis not present

## 2017-11-27 DIAGNOSIS — Z85828 Personal history of other malignant neoplasm of skin: Secondary | ICD-10-CM | POA: Diagnosis not present

## 2017-11-27 DIAGNOSIS — L570 Actinic keratosis: Secondary | ICD-10-CM | POA: Diagnosis not present

## 2017-11-27 DIAGNOSIS — L918 Other hypertrophic disorders of the skin: Secondary | ICD-10-CM | POA: Diagnosis not present

## 2017-11-27 DIAGNOSIS — L821 Other seborrheic keratosis: Secondary | ICD-10-CM | POA: Diagnosis not present

## 2017-11-27 DIAGNOSIS — L814 Other melanin hyperpigmentation: Secondary | ICD-10-CM | POA: Diagnosis not present

## 2017-11-27 DIAGNOSIS — D225 Melanocytic nevi of trunk: Secondary | ICD-10-CM | POA: Diagnosis not present

## 2018-02-25 DIAGNOSIS — I1310 Hypertensive heart and chronic kidney disease without heart failure, with stage 1 through stage 4 chronic kidney disease, or unspecified chronic kidney disease: Secondary | ICD-10-CM | POA: Diagnosis not present

## 2018-02-25 DIAGNOSIS — E1151 Type 2 diabetes mellitus with diabetic peripheral angiopathy without gangrene: Secondary | ICD-10-CM | POA: Diagnosis not present

## 2018-02-25 DIAGNOSIS — Z6832 Body mass index (BMI) 32.0-32.9, adult: Secondary | ICD-10-CM | POA: Diagnosis not present

## 2018-02-25 DIAGNOSIS — E7800 Pure hypercholesterolemia, unspecified: Secondary | ICD-10-CM | POA: Diagnosis not present

## 2018-02-25 DIAGNOSIS — Z794 Long term (current) use of insulin: Secondary | ICD-10-CM | POA: Diagnosis not present

## 2018-02-25 DIAGNOSIS — E668 Other obesity: Secondary | ICD-10-CM | POA: Diagnosis not present

## 2018-02-25 DIAGNOSIS — I208 Other forms of angina pectoris: Secondary | ICD-10-CM | POA: Diagnosis not present

## 2018-02-25 DIAGNOSIS — I10 Essential (primary) hypertension: Secondary | ICD-10-CM | POA: Diagnosis not present

## 2018-02-25 DIAGNOSIS — I2510 Atherosclerotic heart disease of native coronary artery without angina pectoris: Secondary | ICD-10-CM | POA: Diagnosis not present

## 2018-02-25 DIAGNOSIS — D692 Other nonthrombocytopenic purpura: Secondary | ICD-10-CM | POA: Diagnosis not present

## 2018-02-25 DIAGNOSIS — Z955 Presence of coronary angioplasty implant and graft: Secondary | ICD-10-CM | POA: Diagnosis not present

## 2018-02-25 DIAGNOSIS — N183 Chronic kidney disease, stage 3 (moderate): Secondary | ICD-10-CM | POA: Diagnosis not present

## 2018-06-04 DIAGNOSIS — Z85828 Personal history of other malignant neoplasm of skin: Secondary | ICD-10-CM | POA: Diagnosis not present

## 2018-06-04 DIAGNOSIS — L814 Other melanin hyperpigmentation: Secondary | ICD-10-CM | POA: Diagnosis not present

## 2018-06-04 DIAGNOSIS — L570 Actinic keratosis: Secondary | ICD-10-CM | POA: Diagnosis not present

## 2018-08-28 DIAGNOSIS — N183 Chronic kidney disease, stage 3 (moderate): Secondary | ICD-10-CM | POA: Diagnosis not present

## 2018-08-28 DIAGNOSIS — E1151 Type 2 diabetes mellitus with diabetic peripheral angiopathy without gangrene: Secondary | ICD-10-CM | POA: Diagnosis not present

## 2018-08-28 DIAGNOSIS — R82998 Other abnormal findings in urine: Secondary | ICD-10-CM | POA: Diagnosis not present

## 2018-08-28 DIAGNOSIS — E7800 Pure hypercholesterolemia, unspecified: Secondary | ICD-10-CM | POA: Diagnosis not present

## 2018-08-28 DIAGNOSIS — Z125 Encounter for screening for malignant neoplasm of prostate: Secondary | ICD-10-CM | POA: Diagnosis not present

## 2018-09-04 DIAGNOSIS — Z1389 Encounter for screening for other disorder: Secondary | ICD-10-CM | POA: Diagnosis not present

## 2018-09-04 DIAGNOSIS — I2510 Atherosclerotic heart disease of native coronary artery without angina pectoris: Secondary | ICD-10-CM | POA: Diagnosis not present

## 2018-09-04 DIAGNOSIS — E1151 Type 2 diabetes mellitus with diabetic peripheral angiopathy without gangrene: Secondary | ICD-10-CM | POA: Diagnosis not present

## 2018-09-04 DIAGNOSIS — I208 Other forms of angina pectoris: Secondary | ICD-10-CM | POA: Diagnosis not present

## 2018-09-04 DIAGNOSIS — I10 Essential (primary) hypertension: Secondary | ICD-10-CM | POA: Diagnosis not present

## 2018-09-04 DIAGNOSIS — N183 Chronic kidney disease, stage 3 (moderate): Secondary | ICD-10-CM | POA: Diagnosis not present

## 2018-09-04 DIAGNOSIS — Z6830 Body mass index (BMI) 30.0-30.9, adult: Secondary | ICD-10-CM | POA: Diagnosis not present

## 2018-09-04 DIAGNOSIS — E7800 Pure hypercholesterolemia, unspecified: Secondary | ICD-10-CM | POA: Diagnosis not present

## 2018-09-04 DIAGNOSIS — Z794 Long term (current) use of insulin: Secondary | ICD-10-CM | POA: Diagnosis not present

## 2018-09-04 DIAGNOSIS — Z0000 Encounter for general adult medical examination without abnormal findings: Secondary | ICD-10-CM | POA: Diagnosis not present

## 2018-09-04 DIAGNOSIS — Z125 Encounter for screening for malignant neoplasm of prostate: Secondary | ICD-10-CM | POA: Diagnosis not present

## 2018-09-04 DIAGNOSIS — I1310 Hypertensive heart and chronic kidney disease without heart failure, with stage 1 through stage 4 chronic kidney disease, or unspecified chronic kidney disease: Secondary | ICD-10-CM | POA: Diagnosis not present

## 2019-01-06 DIAGNOSIS — Z803 Family history of malignant neoplasm of breast: Secondary | ICD-10-CM | POA: Diagnosis not present

## 2019-01-06 DIAGNOSIS — Z85820 Personal history of malignant melanoma of skin: Secondary | ICD-10-CM | POA: Diagnosis not present

## 2019-01-06 DIAGNOSIS — Z85828 Personal history of other malignant neoplasm of skin: Secondary | ICD-10-CM | POA: Diagnosis not present

## 2019-01-06 DIAGNOSIS — L821 Other seborrheic keratosis: Secondary | ICD-10-CM | POA: Diagnosis not present

## 2019-01-06 DIAGNOSIS — D692 Other nonthrombocytopenic purpura: Secondary | ICD-10-CM | POA: Diagnosis not present

## 2019-01-06 DIAGNOSIS — J8410 Pulmonary fibrosis, unspecified: Secondary | ICD-10-CM | POA: Diagnosis not present

## 2019-01-06 DIAGNOSIS — D225 Melanocytic nevi of trunk: Secondary | ICD-10-CM | POA: Diagnosis not present

## 2019-01-06 DIAGNOSIS — L570 Actinic keratosis: Secondary | ICD-10-CM | POA: Diagnosis not present

## 2019-01-06 DIAGNOSIS — D1801 Hemangioma of skin and subcutaneous tissue: Secondary | ICD-10-CM | POA: Diagnosis not present

## 2019-01-06 DIAGNOSIS — L814 Other melanin hyperpigmentation: Secondary | ICD-10-CM | POA: Diagnosis not present

## 2019-01-06 DIAGNOSIS — Z86010 Personal history of colonic polyps: Secondary | ICD-10-CM | POA: Diagnosis not present

## 2019-03-25 DIAGNOSIS — I2510 Atherosclerotic heart disease of native coronary artery without angina pectoris: Secondary | ICD-10-CM | POA: Diagnosis not present

## 2019-03-25 DIAGNOSIS — I1310 Hypertensive heart and chronic kidney disease without heart failure, with stage 1 through stage 4 chronic kidney disease, or unspecified chronic kidney disease: Secondary | ICD-10-CM | POA: Diagnosis not present

## 2019-03-25 DIAGNOSIS — I209 Angina pectoris, unspecified: Secondary | ICD-10-CM | POA: Diagnosis not present

## 2019-03-25 DIAGNOSIS — E7800 Pure hypercholesterolemia, unspecified: Secondary | ICD-10-CM | POA: Diagnosis not present

## 2019-03-25 DIAGNOSIS — D692 Other nonthrombocytopenic purpura: Secondary | ICD-10-CM | POA: Diagnosis not present

## 2019-03-25 DIAGNOSIS — Z955 Presence of coronary angioplasty implant and graft: Secondary | ICD-10-CM | POA: Diagnosis not present

## 2019-03-25 DIAGNOSIS — R809 Proteinuria, unspecified: Secondary | ICD-10-CM | POA: Diagnosis not present

## 2019-03-25 DIAGNOSIS — E669 Obesity, unspecified: Secondary | ICD-10-CM | POA: Diagnosis not present

## 2019-03-25 DIAGNOSIS — J302 Other seasonal allergic rhinitis: Secondary | ICD-10-CM | POA: Diagnosis not present

## 2019-03-25 DIAGNOSIS — Z794 Long term (current) use of insulin: Secondary | ICD-10-CM | POA: Diagnosis not present

## 2019-03-25 DIAGNOSIS — N183 Chronic kidney disease, stage 3 (moderate): Secondary | ICD-10-CM | POA: Diagnosis not present

## 2019-03-25 DIAGNOSIS — E1151 Type 2 diabetes mellitus with diabetic peripheral angiopathy without gangrene: Secondary | ICD-10-CM | POA: Diagnosis not present

## 2019-04-02 DIAGNOSIS — E1151 Type 2 diabetes mellitus with diabetic peripheral angiopathy without gangrene: Secondary | ICD-10-CM | POA: Diagnosis not present

## 2019-07-07 DIAGNOSIS — Z85828 Personal history of other malignant neoplasm of skin: Secondary | ICD-10-CM | POA: Diagnosis not present

## 2019-07-07 DIAGNOSIS — L570 Actinic keratosis: Secondary | ICD-10-CM | POA: Diagnosis not present

## 2019-07-07 DIAGNOSIS — L821 Other seborrheic keratosis: Secondary | ICD-10-CM | POA: Diagnosis not present

## 2019-07-07 DIAGNOSIS — D692 Other nonthrombocytopenic purpura: Secondary | ICD-10-CM | POA: Diagnosis not present

## 2019-07-15 DIAGNOSIS — E119 Type 2 diabetes mellitus without complications: Secondary | ICD-10-CM | POA: Diagnosis not present

## 2019-07-15 DIAGNOSIS — Z961 Presence of intraocular lens: Secondary | ICD-10-CM | POA: Diagnosis not present

## 2019-07-15 DIAGNOSIS — H35413 Lattice degeneration of retina, bilateral: Secondary | ICD-10-CM | POA: Diagnosis not present

## 2019-09-02 DIAGNOSIS — E1151 Type 2 diabetes mellitus with diabetic peripheral angiopathy without gangrene: Secondary | ICD-10-CM | POA: Diagnosis not present

## 2019-09-02 DIAGNOSIS — I10 Essential (primary) hypertension: Secondary | ICD-10-CM | POA: Diagnosis not present

## 2019-09-02 DIAGNOSIS — Z125 Encounter for screening for malignant neoplasm of prostate: Secondary | ICD-10-CM | POA: Diagnosis not present

## 2019-09-02 DIAGNOSIS — E7800 Pure hypercholesterolemia, unspecified: Secondary | ICD-10-CM | POA: Diagnosis not present

## 2019-09-03 DIAGNOSIS — R82998 Other abnormal findings in urine: Secondary | ICD-10-CM | POA: Diagnosis not present

## 2019-09-09 DIAGNOSIS — I209 Angina pectoris, unspecified: Secondary | ICD-10-CM | POA: Diagnosis not present

## 2019-09-09 DIAGNOSIS — E1129 Type 2 diabetes mellitus with other diabetic kidney complication: Secondary | ICD-10-CM | POA: Diagnosis not present

## 2019-09-09 DIAGNOSIS — Z955 Presence of coronary angioplasty implant and graft: Secondary | ICD-10-CM | POA: Diagnosis not present

## 2019-09-09 DIAGNOSIS — Z1339 Encounter for screening examination for other mental health and behavioral disorders: Secondary | ICD-10-CM | POA: Diagnosis not present

## 2019-09-09 DIAGNOSIS — N1831 Chronic kidney disease, stage 3a: Secondary | ICD-10-CM | POA: Diagnosis not present

## 2019-09-09 DIAGNOSIS — E669 Obesity, unspecified: Secondary | ICD-10-CM | POA: Diagnosis not present

## 2019-09-09 DIAGNOSIS — E7800 Pure hypercholesterolemia, unspecified: Secondary | ICD-10-CM | POA: Diagnosis not present

## 2019-09-09 DIAGNOSIS — I1310 Hypertensive heart and chronic kidney disease without heart failure, with stage 1 through stage 4 chronic kidney disease, or unspecified chronic kidney disease: Secondary | ICD-10-CM | POA: Diagnosis not present

## 2019-09-09 DIAGNOSIS — R809 Proteinuria, unspecified: Secondary | ICD-10-CM | POA: Diagnosis not present

## 2019-09-09 DIAGNOSIS — E1151 Type 2 diabetes mellitus with diabetic peripheral angiopathy without gangrene: Secondary | ICD-10-CM | POA: Diagnosis not present

## 2019-09-09 DIAGNOSIS — Z0000 Encounter for general adult medical examination without abnormal findings: Secondary | ICD-10-CM | POA: Diagnosis not present

## 2019-09-09 DIAGNOSIS — Z1331 Encounter for screening for depression: Secondary | ICD-10-CM | POA: Diagnosis not present

## 2019-09-09 DIAGNOSIS — D692 Other nonthrombocytopenic purpura: Secondary | ICD-10-CM | POA: Diagnosis not present

## 2019-09-09 DIAGNOSIS — I2510 Atherosclerotic heart disease of native coronary artery without angina pectoris: Secondary | ICD-10-CM | POA: Diagnosis not present

## 2019-10-12 ENCOUNTER — Ambulatory Visit: Payer: Commercial Managed Care - HMO

## 2019-10-13 ENCOUNTER — Ambulatory Visit: Payer: PPO | Attending: Internal Medicine

## 2019-10-13 DIAGNOSIS — Z23 Encounter for immunization: Secondary | ICD-10-CM | POA: Insufficient documentation

## 2019-10-13 NOTE — Progress Notes (Signed)
   Covid-19 Vaccination Clinic  Name:  Jimmy Hughes    MRN: [id number] DOB: 1941-04-29  10/13/2019  [name] was observed post Covid-19 immunization for 15 minutes without incidence. He was provided with Vaccine Information Sheet and instruction to access the V-Safe system.   [name] was instructed to call 911 with any severe reactions post vaccine: Marland Kitchen Difficulty breathing  . Swelling of your face and throat  . A fast heartbeat  . A bad rash all over your body  . Dizziness and weakness    Immunizations Administered    Name Date Dose VIS Date Route   Pfizer COVID-19 Vaccine 10/13/2019  2:17 AM 0.3 mL 09/04/2019 Intramuscular   Manufacturer: [employer], [employer]   Lot: [id number]   [place]: [id number]

## 2019-11-02 ENCOUNTER — Ambulatory Visit: Payer: PPO | Attending: Internal Medicine

## 2019-11-02 DIAGNOSIS — Z23 Encounter for immunization: Secondary | ICD-10-CM | POA: Insufficient documentation

## 2019-11-02 NOTE — Progress Notes (Signed)
   Covid-19 Vaccination Clinic  Name:  [name]    MRN: [id number] DOB: 02/21/1941  11/02/2019  Mr. Jimmy Hughes was observed post Covid-19 immunization for 15 minutes without incidence. He was provided with Vaccine Information Sheet and instruction to access the V-Safe system.   [name] was instructed to call 911 with any severe reactions post vaccine: Marland Kitchen Difficulty breathing  . Swelling of your face and throat  . A fast heartbeat  . A bad rash all over your body  . Dizziness and weakness    Immunizations Administered    Name Date Dose VIS Date Route   Pfizer COVID-19 Vaccine 11/02/2019  3:41 PM 0.3 mL 09/04/2019 Intramuscular   Manufacturer: [place]   Lot: [id number]   [place]: [id number]

## 2020-02-09 DIAGNOSIS — L814 Other melanin hyperpigmentation: Secondary | ICD-10-CM | POA: Diagnosis not present

## 2020-02-09 DIAGNOSIS — L570 Actinic keratosis: Secondary | ICD-10-CM | POA: Diagnosis not present

## 2020-02-09 DIAGNOSIS — D225 Melanocytic nevi of trunk: Secondary | ICD-10-CM | POA: Diagnosis not present

## 2020-02-09 DIAGNOSIS — L821 Other seborrheic keratosis: Secondary | ICD-10-CM | POA: Diagnosis not present

## 2020-02-09 DIAGNOSIS — Z85828 Personal history of other malignant neoplasm of skin: Secondary | ICD-10-CM | POA: Diagnosis not present

## 2020-03-09 DIAGNOSIS — I1310 Hypertensive heart and chronic kidney disease without heart failure, with stage 1 through stage 4 chronic kidney disease, or unspecified chronic kidney disease: Secondary | ICD-10-CM | POA: Diagnosis not present

## 2020-03-09 DIAGNOSIS — R809 Proteinuria, unspecified: Secondary | ICD-10-CM | POA: Diagnosis not present

## 2020-03-09 DIAGNOSIS — N1831 Chronic kidney disease, stage 3a: Secondary | ICD-10-CM | POA: Diagnosis not present

## 2020-03-09 DIAGNOSIS — E669 Obesity, unspecified: Secondary | ICD-10-CM | POA: Diagnosis not present

## 2020-03-09 DIAGNOSIS — E1129 Type 2 diabetes mellitus with other diabetic kidney complication: Secondary | ICD-10-CM | POA: Diagnosis not present

## 2020-03-09 DIAGNOSIS — Z794 Long term (current) use of insulin: Secondary | ICD-10-CM | POA: Diagnosis not present

## 2020-03-09 DIAGNOSIS — I209 Angina pectoris, unspecified: Secondary | ICD-10-CM | POA: Diagnosis not present

## 2020-03-09 DIAGNOSIS — E7800 Pure hypercholesterolemia, unspecified: Secondary | ICD-10-CM | POA: Diagnosis not present

## 2020-03-09 DIAGNOSIS — Z1331 Encounter for screening for depression: Secondary | ICD-10-CM | POA: Diagnosis not present

## 2020-03-09 DIAGNOSIS — E1151 Type 2 diabetes mellitus with diabetic peripheral angiopathy without gangrene: Secondary | ICD-10-CM | POA: Diagnosis not present

## 2020-03-09 DIAGNOSIS — Z955 Presence of coronary angioplasty implant and graft: Secondary | ICD-10-CM | POA: Diagnosis not present

## 2020-03-09 DIAGNOSIS — D692 Other nonthrombocytopenic purpura: Secondary | ICD-10-CM | POA: Diagnosis not present

## 2020-03-24 DIAGNOSIS — E1129 Type 2 diabetes mellitus with other diabetic kidney complication: Secondary | ICD-10-CM | POA: Diagnosis not present

## 2020-07-02 DIAGNOSIS — Z23 Encounter for immunization: Secondary | ICD-10-CM | POA: Diagnosis not present

## 2020-07-26 DIAGNOSIS — H26491 Other secondary cataract, right eye: Secondary | ICD-10-CM | POA: Diagnosis not present

## 2020-07-26 DIAGNOSIS — E119 Type 2 diabetes mellitus without complications: Secondary | ICD-10-CM | POA: Diagnosis not present

## 2020-07-26 DIAGNOSIS — H353131 Nonexudative age-related macular degeneration, bilateral, early dry stage: Secondary | ICD-10-CM | POA: Diagnosis not present

## 2020-07-26 DIAGNOSIS — H52203 Unspecified astigmatism, bilateral: Secondary | ICD-10-CM | POA: Diagnosis not present

## 2020-08-11 DIAGNOSIS — H26491 Other secondary cataract, right eye: Secondary | ICD-10-CM | POA: Diagnosis not present

## 2020-09-06 DIAGNOSIS — E1151 Type 2 diabetes mellitus with diabetic peripheral angiopathy without gangrene: Secondary | ICD-10-CM | POA: Diagnosis not present

## 2020-09-06 DIAGNOSIS — E7800 Pure hypercholesterolemia, unspecified: Secondary | ICD-10-CM | POA: Diagnosis not present

## 2020-09-06 DIAGNOSIS — Z7689 Persons encountering health services in other specified circumstances: Secondary | ICD-10-CM | POA: Diagnosis not present

## 2020-09-06 DIAGNOSIS — Z125 Encounter for screening for malignant neoplasm of prostate: Secondary | ICD-10-CM | POA: Diagnosis not present

## 2020-09-13 DIAGNOSIS — D692 Other nonthrombocytopenic purpura: Secondary | ICD-10-CM | POA: Diagnosis not present

## 2020-09-13 DIAGNOSIS — C4491 Basal cell carcinoma of skin, unspecified: Secondary | ICD-10-CM | POA: Diagnosis not present

## 2020-09-13 DIAGNOSIS — I1310 Hypertensive heart and chronic kidney disease without heart failure, with stage 1 through stage 4 chronic kidney disease, or unspecified chronic kidney disease: Secondary | ICD-10-CM | POA: Diagnosis not present

## 2020-09-13 DIAGNOSIS — E1151 Type 2 diabetes mellitus with diabetic peripheral angiopathy without gangrene: Secondary | ICD-10-CM | POA: Diagnosis not present

## 2020-09-13 DIAGNOSIS — Z955 Presence of coronary angioplasty implant and graft: Secondary | ICD-10-CM | POA: Diagnosis not present

## 2020-09-13 DIAGNOSIS — Z0000 Encounter for general adult medical examination without abnormal findings: Secondary | ICD-10-CM | POA: Diagnosis not present

## 2020-09-13 DIAGNOSIS — R82998 Other abnormal findings in urine: Secondary | ICD-10-CM | POA: Diagnosis not present

## 2020-09-13 DIAGNOSIS — E1129 Type 2 diabetes mellitus with other diabetic kidney complication: Secondary | ICD-10-CM | POA: Diagnosis not present

## 2020-09-13 DIAGNOSIS — Z794 Long term (current) use of insulin: Secondary | ICD-10-CM | POA: Diagnosis not present

## 2020-09-13 DIAGNOSIS — E7800 Pure hypercholesterolemia, unspecified: Secondary | ICD-10-CM | POA: Diagnosis not present

## 2020-09-13 DIAGNOSIS — N1832 Chronic kidney disease, stage 3b: Secondary | ICD-10-CM | POA: Diagnosis not present

## 2020-09-13 DIAGNOSIS — I2510 Atherosclerotic heart disease of native coronary artery without angina pectoris: Secondary | ICD-10-CM | POA: Diagnosis not present

## 2020-09-13 DIAGNOSIS — R809 Proteinuria, unspecified: Secondary | ICD-10-CM | POA: Diagnosis not present

## 2021-02-27 ENCOUNTER — Ambulatory Visit (INDEPENDENT_AMBULATORY_CARE_PROVIDER_SITE_OTHER): Payer: PPO | Admitting: Otolaryngology

## 2021-02-27 ENCOUNTER — Other Ambulatory Visit: Payer: Self-pay

## 2021-02-27 VITALS — Temp 97.0°F

## 2021-02-27 DIAGNOSIS — H6123 Impacted cerumen, bilateral: Secondary | ICD-10-CM

## 2021-02-27 DIAGNOSIS — J310 Chronic rhinitis: Secondary | ICD-10-CM | POA: Diagnosis not present

## 2021-02-27 NOTE — Progress Notes (Signed)
HPI: Jimmy Hughes is a 80 y.o. male who presents is referred by PCP for evaluation of wax buildup in his ears.  He does not really notice any hearing problems and did not want a hearing test.  He also complains of nasal congestion and a runny nose most of the time..  Past Medical History:  Diagnosis Date  . Allergy    seasonal  . Arthritis   . Atrial flutter ([place])   . CAD (coronary artery disease)   . Cataract    surgery  . Diabetes mellitus    type 2  . Hypercholesteremia   . Hypertension    Past Surgical History:  Procedure Laterality Date  . CARDIOVERSION  07/09/05  . CATARACT EXTRACTION Bilateral   . CORONARY ARTERY BYPASS GRAFT  07/02/05   x3  . s/p inguinal hernia repair     Social History   Socioeconomic History  . Marital status: Married    Spouse name: Not on file  . Number of children: Not on file  . Years of education: Not on file  . Highest education level: Not on file  Occupational History  . Not on file  Tobacco Use  . Smoking status: Former Smoker    Types: Cigarettes    Quit date: 10/28/1984    Years since quitting: 36.3  . Smokeless tobacco: Never Used  Substance and Sexual Activity  . Alcohol use: Yes    Alcohol/week: 0.0 standard drinks    Comment: rare  . Drug use: Not on file  . Sexual activity: Not on file  Other Topics Concern  . Not on file  Social History Narrative  . Not on file   Social Determinants of Health   Financial Resource Strain: Not on file  Food Insecurity: Not on file  Transportation Needs: Not on file  Physical Activity: Not on file  Stress: Not on file  Social Connections: Not on file   Family History  Problem Relation Age of Onset  . Other Father        complications from diverticulitis surgery  . Cancer Mother        colon  . Colon cancer Mother 101   No Known Allergies Prior to Admission medications   Medication Sig Start Date End Date Taking? Authorizing Provider  acetaminophen (TYLENOL) 325 MG tablet  Take 650 mg by mouth every 6 (six) hours as needed.    [provider]  aspirin 81 MG tablet Take 81 mg by mouth daily.      [provider]  atorvastatin (LIPITOR) 80 MG tablet Take 80 mg by mouth daily.      [provider]  cetirizine (ZYRTEC) 10 MG tablet Take 10 mg by mouth daily.    [provider]  CINNAMON PO Take 1 tablet by mouth daily.    [provider]  fish oil-omega-3 fatty acids 1000 MG capsule Take by mouth. Take 2 at hs     [provider]  glimepiride (AMARYL) 4 MG tablet Take 4 mg by mouth daily with breakfast.    [provider]  linagliptin (TRADJENTA) 5 MG TABS tablet Take 5 mg by mouth daily.    [provider]  metFORMIN (GLUCOPHAGE) 500 MG tablet Take 1,000 mg by mouth daily with breakfast.    [provider]  metFORMIN (GLUCOPHAGE) 500 MG tablet Take 500 mg by mouth at bedtime.    [provider]  metoprolol (LOPRESSOR) 50 MG tablet Take 50 mg by  mouth 2 (two) times daily.      [provider]  pioglitazone (ACTOS) 45 MG tablet Take 45 mg by mouth daily.      [provider]  valsartan (DIOVAN) 320 MG tablet Take 320 mg by mouth daily.      [provider]     Positive ROS: Otherwise negative  All other systems have been reviewed and were otherwise negative with the exception of those mentioned in the HPI and as above.  Physical Exam: Constitutional: Alert, well-appearing, no acute distress Ears: External ears without lesions or tenderness.  He had a moderate amount of wax more in the right ear than the left that was cleaned with curettes.  The TMs were clear bilaterally with good mobility on pneumatic otoscopy.  Hearing screening with a 1024 tuning fork revealed reasonably good hearing in both ears which was symmetric. Nasal: External nose without lesions. Septum with mild deformity and mild rhinitis.  After decongesting the nose both middle meatus  regions were clear with no signs of infection.  No polyps.  Oral: Lips and gums without lesions. Tongue and palate mucosa without lesions. Posterior oropharynx clear. Neck: No palpable adenopathy or masses Respiratory: Breathing comfortably  Skin: No facial/neck lesions or rash noted.  Cerumen impaction removal  Date/Time: 02/27/2021 2:32 PM Performed by: Rozetta Nunnery, MD Authorized by: Rozetta Nunnery, MD   Consent:    Consent obtained:  Verbal   Consent given by:  Patient   Risks discussed:  Pain and bleeding Procedure details:    Location:  L ear and R ear   Procedure type: curette   Post-procedure details:    Inspection:  TM intact and canal normal   Hearing quality:  Improved   Patient tolerance of procedure:  Tolerated well, no immediate complications Comments:     Wax buildup mostly in the right ear canal.  Small amount of wax on the left side.    Assessment: Cerumen buildup right ear worse than left. Chronic rhinitis.  Plan: For the nasal drainage suggested use of Flonase 2 sprays each nostril at night as well as use of saline irrigations during the day as needed excessive mucus discharge. He can also take antihistamines and suggested trying Allegra as this is nonsedating.  I discussed with him concerning avoiding decongestant use as this will affect blood pressure and heart rhythm. He will follow-up as needed.   Radene Journey, MD   CC:

## 2021-03-07 DIAGNOSIS — L720 Epidermal cyst: Secondary | ICD-10-CM | POA: Diagnosis not present

## 2021-03-07 DIAGNOSIS — D485 Neoplasm of uncertain behavior of skin: Secondary | ICD-10-CM | POA: Diagnosis not present

## 2021-03-07 DIAGNOSIS — D692 Other nonthrombocytopenic purpura: Secondary | ICD-10-CM | POA: Diagnosis not present

## 2021-03-07 DIAGNOSIS — C44622 Squamous cell carcinoma of skin of right upper limb, including shoulder: Secondary | ICD-10-CM | POA: Diagnosis not present

## 2021-03-07 DIAGNOSIS — D225 Melanocytic nevi of trunk: Secondary | ICD-10-CM | POA: Diagnosis not present

## 2021-03-07 DIAGNOSIS — L814 Other melanin hyperpigmentation: Secondary | ICD-10-CM | POA: Diagnosis not present

## 2021-03-07 DIAGNOSIS — Z85828 Personal history of other malignant neoplasm of skin: Secondary | ICD-10-CM | POA: Diagnosis not present

## 2021-03-07 DIAGNOSIS — L821 Other seborrheic keratosis: Secondary | ICD-10-CM | POA: Diagnosis not present

## 2021-03-15 DIAGNOSIS — E1129 Type 2 diabetes mellitus with other diabetic kidney complication: Secondary | ICD-10-CM | POA: Diagnosis not present

## 2021-03-15 DIAGNOSIS — E1151 Type 2 diabetes mellitus with diabetic peripheral angiopathy without gangrene: Secondary | ICD-10-CM | POA: Diagnosis not present

## 2021-03-15 DIAGNOSIS — N1832 Chronic kidney disease, stage 3b: Secondary | ICD-10-CM | POA: Diagnosis not present

## 2021-03-15 DIAGNOSIS — Z794 Long term (current) use of insulin: Secondary | ICD-10-CM | POA: Diagnosis not present

## 2021-03-15 DIAGNOSIS — Z955 Presence of coronary angioplasty implant and graft: Secondary | ICD-10-CM | POA: Diagnosis not present

## 2021-03-15 DIAGNOSIS — I209 Angina pectoris, unspecified: Secondary | ICD-10-CM | POA: Diagnosis not present

## 2021-03-15 DIAGNOSIS — D692 Other nonthrombocytopenic purpura: Secondary | ICD-10-CM | POA: Diagnosis not present

## 2021-03-15 DIAGNOSIS — I2510 Atherosclerotic heart disease of native coronary artery without angina pectoris: Secondary | ICD-10-CM | POA: Diagnosis not present

## 2021-03-15 DIAGNOSIS — E669 Obesity, unspecified: Secondary | ICD-10-CM | POA: Diagnosis not present

## 2021-03-15 DIAGNOSIS — E7800 Pure hypercholesterolemia, unspecified: Secondary | ICD-10-CM | POA: Diagnosis not present

## 2021-03-15 DIAGNOSIS — Z6831 Body mass index (BMI) 31.0-31.9, adult: Secondary | ICD-10-CM | POA: Diagnosis not present

## 2021-03-15 DIAGNOSIS — I1310 Hypertensive heart and chronic kidney disease without heart failure, with stage 1 through stage 4 chronic kidney disease, or unspecified chronic kidney disease: Secondary | ICD-10-CM | POA: Diagnosis not present

## 2021-06-07 ENCOUNTER — Ambulatory Visit (INDEPENDENT_AMBULATORY_CARE_PROVIDER_SITE_OTHER): Payer: PPO | Admitting: Otolaryngology

## 2021-06-07 ENCOUNTER — Other Ambulatory Visit: Payer: Self-pay

## 2021-06-07 DIAGNOSIS — R040 Epistaxis: Secondary | ICD-10-CM | POA: Diagnosis not present

## 2021-06-07 NOTE — Progress Notes (Signed)
HPI: Jimmy Hughes is a 80 y.o. male who presents for evaluation of left-sided epistaxis.  He had a couple of nosebleeds this past week and 1 real bad nosebleed they had difficulty stopping.  Past Medical History:  Diagnosis Date   Allergy    seasonal   Arthritis    Atrial flutter ([facility])    CAD (coronary artery disease)    Cataract    surgery   Diabetes mellitus    type 2   Hypercholesteremia    Hypertension    Past Surgical History:  Procedure Laterality Date   CARDIOVERSION  07/09/05   CATARACT EXTRACTION Bilateral    CORONARY ARTERY BYPASS GRAFT  07/02/05   x3   s/p inguinal hernia repair     Social History   Socioeconomic History   Marital status: Married    Spouse name: Not on file   Number of children: Not on file   Years of education: Not on file   Highest education level: Not on file  Occupational History   Not on file  Tobacco Use   Smoking status: Former    Types: Cigarettes    Quit date: 10/28/1984    Years since quitting: 36.6   Smokeless tobacco: Never  Substance and Sexual Activity   Alcohol use: Yes    Alcohol/week: 0.0 standard drinks    Comment: rare   Drug use: Not on file   Sexual activity: Not on file  Other Topics Concern   Not on file  Social History Narrative   Not on file   Social Determinants of Health   Financial Resource Strain: Not on file  Food Insecurity: Not on file  Transportation Needs: Not on file  Physical Activity: Not on file  Stress: Not on file  Social Connections: Not on file   Family History  Problem Relation Age of Onset   Other Father        complications from diverticulitis surgery   Cancer Mother        colon   Colon cancer Mother 67   No Known Allergies Prior to Admission medications   Medication Sig Start Date End Date Taking? Authorizing Provider  acetaminophen (TYLENOL) 325 MG tablet Take 650 mg by mouth every 6 (six) hours as needed.    [provider]  aspirin 81 MG tablet Take 81 mg by  mouth daily.      [provider]  atorvastatin (LIPITOR) 80 MG tablet Take 80 mg by mouth daily.      [provider]  cetirizine (ZYRTEC) 10 MG tablet Take 10 mg by mouth daily.    [provider]  CINNAMON PO Take 1 tablet by mouth daily.    [provider]  fish oil-omega-3 fatty acids 1000 MG capsule Take by mouth. Take 2 at hs     [provider]  glimepiride (AMARYL) 4 MG tablet Take 4 mg by mouth daily with breakfast.    [provider]  linagliptin (TRADJENTA) 5 MG TABS tablet Take 5 mg by mouth daily.    [provider]  metFORMIN (GLUCOPHAGE) 500 MG tablet Take 1,000 mg by mouth daily with breakfast.    [provider]  metFORMIN (GLUCOPHAGE) 500 MG tablet Take 500 mg by mouth at bedtime.    [provider]  metoprolol (LOPRESSOR) 50 MG tablet Take 50 mg by mouth 2 (two) times daily.      [provider]  pioglitazone (ACTOS) 45 MG tablet Take 45  mg by mouth daily.      [provider]  valsartan (DIOVAN) 320 MG tablet Take 320 mg by mouth daily.      [provider]     Positive ROS: Otherwise negative  All other systems have been reviewed and were otherwise negative with the exception of those mentioned in the HPI and as above.  Physical Exam: Constitutional: Alert, well-appearing, no acute distress Ears: External ears without lesions or tenderness. Ear canals are clear bilaterally with intact, clear TMs.  Nasal: External nose without lesions. Septum slightly deviated to the right.  Has a concavity of the [place] septum on the left side at the posterior mid upper area of the concavity has a small vessel with a scab that is the most likely source of his bleeding.  Remaining nasal cavity was clear.  This vessel and scab was cauterized using silver nitrate.  Remaining nasal cavity was clear he is having no trouble breathing..  Oral: Lips and gums without lesions. Tongue and  palate mucosa without lesions. Posterior oropharynx clear. Neck: No palpable adenopathy or masses Respiratory: Breathing comfortably  Skin: No facial/neck lesions or rash noted.  Control of epistaxis  Date/Time: 06/07/2021 12:39 PM Performed by: Rozetta Nunnery, MD Authorized by: Rozetta Nunnery, MD   Consent:    Consent obtained:  Verbal   Consent given by:  Patient Anesthesia:    Anesthesia method:  None Procedure details:    Treatment site:  L anterior   Treatment method:  Silver nitrate   Treatment complexity:  Limited   Treatment episode: initial   Comments:     Left mid superior septum was cauterized using silver nitrate.  Remaining nasal cavity was clear.  Assessment: Epistaxis from left septal vessel.  Plan: This was cauterized in the office using silver nitrate. Recommended not blowing his nose hard for the next 10 days and will notify us if he has any further nosebleeds. Also reviewed with him concerning using a cottonball and Afrin nasal packing if he has any further nosebleeds as he had difficulty stopping this last nosebleed.  Radene Journey, MD

## 2021-06-15 DIAGNOSIS — R350 Frequency of micturition: Secondary | ICD-10-CM | POA: Diagnosis not present

## 2021-06-15 DIAGNOSIS — R351 Nocturia: Secondary | ICD-10-CM | POA: Diagnosis not present

## 2021-06-15 DIAGNOSIS — R3915 Urgency of urination: Secondary | ICD-10-CM | POA: Diagnosis not present

## 2021-06-15 DIAGNOSIS — N401 Enlarged prostate with lower urinary tract symptoms: Secondary | ICD-10-CM | POA: Diagnosis not present

## 2021-06-15 DIAGNOSIS — N5201 Erectile dysfunction due to arterial insufficiency: Secondary | ICD-10-CM | POA: Diagnosis not present

## 2021-06-21 DIAGNOSIS — Z85828 Personal history of other malignant neoplasm of skin: Secondary | ICD-10-CM | POA: Diagnosis not present

## 2021-06-21 DIAGNOSIS — D692 Other nonthrombocytopenic purpura: Secondary | ICD-10-CM | POA: Diagnosis not present

## 2021-06-21 DIAGNOSIS — L821 Other seborrheic keratosis: Secondary | ICD-10-CM | POA: Diagnosis not present

## 2021-06-21 DIAGNOSIS — L814 Other melanin hyperpigmentation: Secondary | ICD-10-CM | POA: Diagnosis not present

## 2021-08-10 DIAGNOSIS — N5089 Other specified disorders of the male genital organs: Secondary | ICD-10-CM | POA: Diagnosis not present

## 2021-08-10 DIAGNOSIS — N401 Enlarged prostate with lower urinary tract symptoms: Secondary | ICD-10-CM | POA: Diagnosis not present

## 2021-08-10 DIAGNOSIS — R351 Nocturia: Secondary | ICD-10-CM | POA: Diagnosis not present

## 2021-08-10 DIAGNOSIS — N5201 Erectile dysfunction due to arterial insufficiency: Secondary | ICD-10-CM | POA: Diagnosis not present

## 2021-09-06 DIAGNOSIS — Z961 Presence of intraocular lens: Secondary | ICD-10-CM | POA: Diagnosis not present

## 2021-09-06 DIAGNOSIS — H52203 Unspecified astigmatism, bilateral: Secondary | ICD-10-CM | POA: Diagnosis not present

## 2021-09-06 DIAGNOSIS — E119 Type 2 diabetes mellitus without complications: Secondary | ICD-10-CM | POA: Diagnosis not present

## 2021-09-06 DIAGNOSIS — H353121 Nonexudative age-related macular degeneration, left eye, early dry stage: Secondary | ICD-10-CM | POA: Diagnosis not present

## 2021-09-21 DIAGNOSIS — E7800 Pure hypercholesterolemia, unspecified: Secondary | ICD-10-CM | POA: Diagnosis not present

## 2021-09-27 DIAGNOSIS — E7800 Pure hypercholesterolemia, unspecified: Secondary | ICD-10-CM | POA: Diagnosis not present

## 2021-09-27 DIAGNOSIS — H9193 Unspecified hearing loss, bilateral: Secondary | ICD-10-CM | POA: Diagnosis not present

## 2021-09-27 DIAGNOSIS — Z794 Long term (current) use of insulin: Secondary | ICD-10-CM | POA: Diagnosis not present

## 2021-09-27 DIAGNOSIS — I2510 Atherosclerotic heart disease of native coronary artery without angina pectoris: Secondary | ICD-10-CM | POA: Diagnosis not present

## 2021-09-27 DIAGNOSIS — E1151 Type 2 diabetes mellitus with diabetic peripheral angiopathy without gangrene: Secondary | ICD-10-CM | POA: Diagnosis not present

## 2021-09-27 DIAGNOSIS — Z955 Presence of coronary angioplasty implant and graft: Secondary | ICD-10-CM | POA: Diagnosis not present

## 2021-09-27 DIAGNOSIS — Z1331 Encounter for screening for depression: Secondary | ICD-10-CM | POA: Diagnosis not present

## 2021-09-27 DIAGNOSIS — E1129 Type 2 diabetes mellitus with other diabetic kidney complication: Secondary | ICD-10-CM | POA: Diagnosis not present

## 2021-09-27 DIAGNOSIS — I1310 Hypertensive heart and chronic kidney disease without heart failure, with stage 1 through stage 4 chronic kidney disease, or unspecified chronic kidney disease: Secondary | ICD-10-CM | POA: Diagnosis not present

## 2021-09-27 DIAGNOSIS — R82998 Other abnormal findings in urine: Secondary | ICD-10-CM | POA: Diagnosis not present

## 2021-09-27 DIAGNOSIS — D692 Other nonthrombocytopenic purpura: Secondary | ICD-10-CM | POA: Diagnosis not present

## 2021-09-27 DIAGNOSIS — Z0000 Encounter for general adult medical examination without abnormal findings: Secondary | ICD-10-CM | POA: Diagnosis not present

## 2021-09-27 DIAGNOSIS — E669 Obesity, unspecified: Secondary | ICD-10-CM | POA: Diagnosis not present

## 2021-09-27 DIAGNOSIS — Z1339 Encounter for screening examination for other mental health and behavioral disorders: Secondary | ICD-10-CM | POA: Diagnosis not present

## 2021-09-27 DIAGNOSIS — N1832 Chronic kidney disease, stage 3b: Secondary | ICD-10-CM | POA: Diagnosis not present

## 2021-11-15 DIAGNOSIS — R350 Frequency of micturition: Secondary | ICD-10-CM | POA: Diagnosis not present

## 2021-11-15 DIAGNOSIS — N5201 Erectile dysfunction due to arterial insufficiency: Secondary | ICD-10-CM | POA: Diagnosis not present

## 2021-11-15 DIAGNOSIS — N401 Enlarged prostate with lower urinary tract symptoms: Secondary | ICD-10-CM | POA: Diagnosis not present

## 2021-11-15 DIAGNOSIS — R351 Nocturia: Secondary | ICD-10-CM | POA: Diagnosis not present

## 2021-11-15 DIAGNOSIS — N3943 Post-void dribbling: Secondary | ICD-10-CM | POA: Diagnosis not present

## 2022-03-20 ENCOUNTER — Other Ambulatory Visit: Payer: Self-pay

## 2022-03-20 ENCOUNTER — Encounter (HOSPITAL_COMMUNITY): Payer: Self-pay

## 2022-03-20 ENCOUNTER — Emergency Department (HOSPITAL_COMMUNITY)
Admission: EM | Admit: 2022-03-20 | Discharge: 2022-03-20 | Disposition: A | Discharge status: Home / Self Care | Payer: PPO | Attending: Emergency Medicine | Admitting: Emergency Medicine

## 2022-03-20 DIAGNOSIS — Z7984 Long term (current) use of oral hypoglycemic drugs: Secondary | ICD-10-CM | POA: Diagnosis not present

## 2022-03-20 DIAGNOSIS — R55 Syncope and collapse: Secondary | ICD-10-CM | POA: Diagnosis not present

## 2022-03-20 DIAGNOSIS — Z7982 Long term (current) use of aspirin: Secondary | ICD-10-CM | POA: Insufficient documentation

## 2022-03-20 DIAGNOSIS — I2510 Atherosclerotic heart disease of native coronary artery without angina pectoris: Secondary | ICD-10-CM | POA: Insufficient documentation

## 2022-03-20 DIAGNOSIS — I4430 Unspecified atrioventricular block: Secondary | ICD-10-CM | POA: Diagnosis not present

## 2022-03-20 DIAGNOSIS — I440 Atrioventricular block, first degree: Secondary | ICD-10-CM | POA: Diagnosis not present

## 2022-03-20 DIAGNOSIS — R0902 Hypoxemia: Secondary | ICD-10-CM | POA: Diagnosis not present

## 2022-03-20 DIAGNOSIS — I959 Hypotension, unspecified: Secondary | ICD-10-CM | POA: Diagnosis not present

## 2022-03-20 DIAGNOSIS — Z79899 Other long term (current) drug therapy: Secondary | ICD-10-CM | POA: Diagnosis not present

## 2022-03-20 DIAGNOSIS — E875 Hyperkalemia: Secondary | ICD-10-CM | POA: Insufficient documentation

## 2022-03-20 DIAGNOSIS — E119 Type 2 diabetes mellitus without complications: Secondary | ICD-10-CM | POA: Diagnosis not present

## 2022-03-20 DIAGNOSIS — Z951 Presence of aortocoronary bypass graft: Secondary | ICD-10-CM | POA: Insufficient documentation

## 2022-03-20 DIAGNOSIS — N179 Acute kidney failure, unspecified: Secondary | ICD-10-CM | POA: Diagnosis not present

## 2022-03-20 DIAGNOSIS — I10 Essential (primary) hypertension: Secondary | ICD-10-CM | POA: Insufficient documentation

## 2022-03-20 DIAGNOSIS — R42 Dizziness and giddiness: Secondary | ICD-10-CM | POA: Diagnosis not present

## 2022-03-20 HISTORY — DX: Syncope and collapse: R55

## 2022-03-20 LAB — CBC WITH DIFFERENTIAL/PLATELET
Abs Immature Granulocytes: 0.05 10*3/uL (ref 0.00–0.07)
Basophils Absolute: 0 10*3/uL (ref 0.0–0.1)
Basophils Relative: 0 %
Eosinophils Absolute: 0.1 10*3/uL (ref 0.0–0.5)
Eosinophils Relative: 1 %
HCT: 43.5 % (ref 39.0–52.0)
Hemoglobin: 13.7 g/dL (ref 13.0–17.0)
Immature Granulocytes: 1 %
Lymphocytes Relative: 12 %
Lymphs Abs: 1.2 10*3/uL (ref 0.7–4.0)
MCH: 29.8 pg (ref 26.0–34.0)
MCHC: 31.5 g/dL (ref 30.0–36.0)
MCV: 94.6 fL (ref 80.0–100.0)
Monocytes Absolute: 0.9 10*3/uL (ref 0.1–1.0)
Monocytes Relative: 9 %
Neutro Abs: 7.8 10*3/uL — ABNORMAL HIGH (ref 1.7–7.7)
Neutrophils Relative %: 77 %
Platelets: 151 10*3/uL (ref 150–400)
RBC: 4.6 MIL/uL (ref 4.22–5.81)
RDW: 13.4 % (ref 11.5–15.5)
WBC: 10.1 10*3/uL (ref 4.0–10.5)
nRBC: 0 % (ref 0.0–0.2)

## 2022-03-20 LAB — COMPREHENSIVE METABOLIC PANEL
ALT: 18 U/L (ref 0–44)
AST: 20 U/L (ref 15–41)
Albumin: 3.6 g/dL (ref 3.5–5.0)
Alkaline Phosphatase: 47 U/L (ref 38–126)
Anion gap: 10 (ref 5–15)
BUN: 45 mg/dL — ABNORMAL HIGH (ref 8–23)
CO2: 22 mmol/L (ref 22–32)
Calcium: 9.6 mg/dL (ref 8.9–10.3)
Chloride: 107 mmol/L (ref 98–111)
Creatinine, Ser: 2.59 mg/dL — ABNORMAL HIGH (ref 0.61–1.24)
GFR, Estimated: 24 mL/min — ABNORMAL LOW (ref >60)
Glucose, Bld: 150 mg/dL — ABNORMAL HIGH (ref 70–99)
Potassium: 5.4 mmol/L — ABNORMAL HIGH (ref 3.5–5.1)
Sodium: 139 mmol/L (ref 135–145)
Total Bilirubin: 1 mg/dL (ref 0.3–1.2)
Total Protein: 7 g/dL (ref 6.5–8.1)

## 2022-03-20 LAB — URINALYSIS, ROUTINE W REFLEX MICROSCOPIC
Bacteria, UA: NONE SEEN
Bilirubin Urine: NEGATIVE
Glucose, UA: >=500 mg/dL — AB
Ketones, ur: NEGATIVE mg/dL
Leukocytes,Ua: NEGATIVE
Nitrite: NEGATIVE
Protein, ur: 100 mg/dL — AB
Specific Gravity, Urine: 1.015 (ref 1.005–1.030)
pH: 5 (ref 5.0–8.0)

## 2022-03-20 LAB — CBG MONITORING, ED: Glucose-Capillary: 150 mg/dL — ABNORMAL HIGH (ref 70–99)

## 2022-03-20 MED ORDER — LOKELMA 5 G PO PACK: 10.0000 g | PACK | Freq: Every day | ORAL | 0 refills | Status: AC

## 2022-03-20 MED ORDER — SODIUM ZIRCONIUM CYCLOSILICATE 5 G PO PACK
5.0000 g | PACK | Freq: Every day | ORAL | Status: DC
  Administered 2022-03-20: 5 g via ORAL
  Filled 2022-03-20: qty 1

## 2022-03-20 MED ORDER — SODIUM CHLORIDE 0.9 % IV BOLUS
500.0000 mL | Freq: Once | INTRAVENOUS | Status: AC
  Administered 2022-03-20: 500 mL via INTRAVENOUS

## 2022-03-20 NOTE — ED Provider Notes (Signed)
[facility] EMERGENCY DEPARTMENT Provider Note   CSN: [id number] Arrival date & time: 03/20/22  1129     History  Chief Complaint  Patient presents with   Dizziness   Hypotension    Jimmy Hughes is a 81 y.o. male.  81 year old male brought in by EMS from the dermatology office today for feeling poorly.  Patient states that he had his usual breakfast, went to the dermatology office, changed into a gown and was sitting on the exam table when he began to feel like his blood sugar may be dropping, generally weak.  Patient moved down to the chair and was sitting in the chair when staff walked in and found him to be diaphoretic and assisted him back to the table to lay down and placed a cool rag on his neck.  Staff checked his blood pressure, he was found to be hypotensive with a blood pressure of 70/48 and EMS was called.  Patient was assisted to the bathroom for fecal urgency, reports he had a loose stool which was nonbloody and upon standing he felt significantly improved.  EMS reports patient's heart rate dropped to 35 with standing.  Patient arrives to the emergency room feeling back to baseline without complaints. Patient took his regular medications this morning, no recent change in medications or change in dose.  Past medical history of coronary artery disease, CABG x3, hyperlipidemia, diabetes, a flutter.  Patient is not anticoagulated.       Home Medications Prior to Admission medications   Medication Sig Start Date End Date Taking? Authorizing Provider  sodium zirconium cyclosilicate (LOKELMA) 5 g packet Take 10 g by mouth daily for 5 days. 03/20/22 03/25/22 Yes Jeannie Fend, PA-C  acetaminophen (TYLENOL) 325 MG tablet Take 650 mg by mouth every 6 (six) hours as needed.    [provider]  aspirin 81 MG tablet Take 81 mg by mouth daily.      [provider]  atorvastatin (LIPITOR) 80 MG tablet Take 80 mg by mouth daily.      [provider]  cetirizine (ZYRTEC) 10 MG tablet Take 10 mg by mouth daily.    [provider]  CINNAMON PO Take 1 tablet by mouth daily.    [provider]  fish oil-omega-3 fatty acids 1000 MG capsule Take by mouth. Take 2 at hs     [provider]  glimepiride (AMARYL) 4 MG tablet Take 4 mg by mouth daily with breakfast.    [provider]  linagliptin (TRADJENTA) 5 MG TABS tablet Take 5 mg by mouth daily.    [provider]  metFORMIN (GLUCOPHAGE) 500 MG tablet Take 1,000 mg by mouth daily with breakfast.    [provider]  metFORMIN (GLUCOPHAGE) 500 MG tablet Take 500 mg by mouth at bedtime.    [provider]  metoprolol (LOPRESSOR) 50 MG tablet Take 50 mg by mouth 2 (two) times daily.      [provider]  pioglitazone (ACTOS) 45 MG tablet Take 45 mg by mouth daily.      [provider]  valsartan (DIOVAN) 320 MG tablet Take 320 mg by mouth daily.      [provider]      Allergies    Patient has no known allergies.    Review of Systems   Review of Systems Negative except as per HPI Physical Exam Updated Vital Signs BP (!) 166/85   Pulse 60  Temp 97.8 F (36.6 C) (Oral)   Resp 19   Ht 6' (1.829 m)   Wt 99.8 kg   SpO2 98%   BMI 29.84 kg/m  Physical Exam Vitals and nursing note reviewed.  Constitutional:      General: He is not in acute distress.    Appearance: He is well-developed. He is not diaphoretic.  HENT:     Head: Normocephalic and atraumatic.     Mouth/Throat:     Mouth: Mucous membranes are moist.  Eyes:     Conjunctiva/sclera: Conjunctivae normal.  Cardiovascular:     Rate and Rhythm: Regular rhythm. Bradycardia present.     Heart sounds: Normal heart sounds.  Pulmonary:     Effort: Pulmonary effort is normal.     Breath sounds: Normal breath sounds.  Abdominal:     Palpations: Abdomen is soft.     Tenderness: There is no abdominal tenderness.   Musculoskeletal:     Right lower leg: No edema.     Left lower leg: No edema.  Skin:    General: Skin is warm and dry.     Findings: No erythema or rash.  Neurological:     Mental Status: He is alert and oriented to person, place, and time.     Sensory: No sensory deficit.     Motor: No weakness.  Psychiatric:        Behavior: Behavior normal.     ED Results / Procedures / Treatments   Labs (all labs ordered are listed, but only abnormal results are displayed) Labs Reviewed  COMPREHENSIVE METABOLIC PANEL - Abnormal; Notable for the following components:      Result Value   Potassium 5.4 (*)    Glucose, Bld 150 (*)    BUN 45 (*)    Creatinine, Ser 2.59 (*)    GFR, Estimated 24 (*)    All other components within normal limits  CBC WITH DIFFERENTIAL/PLATELET - Abnormal; Notable for the following components:   Neutro Abs 7.8 (*)    All other components within normal limits  URINALYSIS, ROUTINE W REFLEX MICROSCOPIC - Abnormal; Notable for the following components:   Glucose, UA >=500 (*)    Hgb urine dipstick SMALL (*)    Protein, ur 100 (*)    All other components within normal limits  CBG MONITORING, ED - Abnormal; Notable for the following components:   Glucose-Capillary 150 (*)    All other components within normal limits    EKG EKG Interpretation  Date/Time:  Tuesday March 20 2022 12:02:05 EDT Ventricular Rate:  56 PR Interval:  310 QRS Duration: 113 QT Interval:  454 QTC Calculation: 439 R Axis:   -58 Text Interpretation: Sinus rhythm Prolonged PR interval Left anterior fascicular block Abnormal R-wave progression, late transition Since last tracing PR interval is longer Otherwise no significant change Confirmed by Mancel Bale [phone]) on 03/20/2022 1:13:37 PM  Radiology No results found.  Procedures .Critical Care  Performed by: Jeannie Fend, PA-C Authorized by: Jeannie Fend, PA-C   Critical care provider statement:    Critical care time  (minutes):  30   Critical care was time spent personally by me on the following activities:  Development of treatment plan with patient or surrogate, discussions with consultants, evaluation of patient's response to treatment, examination of patient, ordering and review of laboratory studies, ordering and review of radiographic studies, ordering and performing treatments and interventions, pulse oximetry, re-evaluation of patient's condition and review of old charts  Medications Ordered in ED Medications  sodium zirconium cyclosilicate (LOKELMA) packet 5 g (5 g Oral Given 03/20/22 1349)  sodium chloride 0.9 % bolus 500 mL (0 mLs Intravenous Stopped 03/20/22 1520)    ED Course/ Medical Decision Making/ A&P Clinical Course as of 03/20/22 1814  Tue Mar 20, 2022  1150 Orthostatic VS: Lying 126/69 HR 56 Sitting 89/79 HR 55 (asymptomatic)  -repeat sitting 117/61 Standing  120/66 HR 57 [LM]    Clinical Course User Index [LM] Jeannie Fend, PA-C                           Medical Decision Making Amount and/or Complexity of Data Reviewed Labs: ordered.  Risk Prescription drug management.   This patient presents to the ED for concern of near syncope, this involves an extensive number of treatment options, and is a complaint that carries with it a high risk of complications and morbidity.  The differential diagnosis includes but not limited to hypoglycemia, electrolyte derangement, orthostatic hypotension, arrhythmia   Co morbidities that complicate the patient evaluation  CAD, hyperlipidemia, diabetes, hypertension, a flutter   Additional history obtained:  Additional history obtained from call to patient's PCP office, discussed with CMA Marchelle Folks who reports prior labs on file from September 21, 2021 with creatinine of 2.0, GFR of 32.2, potassium 5.3.  PCP is out of the office until next Wednesday, Marchelle Folks spoke with the on-call provider for the group Dr.Holwerd who recommends  admission External records from outside source obtained and reviewed including no recent relevant records or labs on file for review   Lab Tests:  I Ordered, and personally interpreted labs.  The pertinent results include: CMP with potassium of 5.4, creatinine 2.459 with GFR of 24 (not significantly changed compared to prior files above as noted per PCP office).  CBC without significant findings, glucose normal at 150 (nonfasting).  Urinalysis with small hemoglobin and protein  Cardiac Monitoring: / EKG:  The patient was maintained on a cardiac monitor.  I personally viewed and interpreted the cardiac monitored which showed an underlying rhythm of: Sinus bradycardia, rate 56   Consultations Obtained:  I requested consultation with the ER attending Dr. Effie Shy,  and discussed lab and imaging findings as well as pertinent plan - they recommend: Patient follow-up with PCP closely of declining admission today.  Recommend to treat with [facility] today and discharged with prescription for same   Problem List / ED Course / Critical interventions / Medication management  81 year old male presents via EMS from dermatology office where he had a near syncopal event as above.  Patient arrives emergency room feeling fine, requesting to be discharged but agreeable to stay for further work-up.  Orthostatics were repeated in the room, normal although did have abnormal hypotension sitting (was noted to be moving at the time blood pressure was assessed), was repeated and is normal.  Patient is bradycardic, he is on metoprolol.  Labs reveal mild hyperkalemia with potassium of 5.4, concern for AKI with creatinine of 2.59.  Called the patient's PCP office for lab comparison, these labs are is not significantly changed from his prior on file.  Patient was recommended for admission by PCP on-call for the office as well as ER team however patient declines admission and states that he would prefer to follow-up with his PCP  in the office next week.  Concern for patient's high dose statin and AKI, advised to hold his statin pending follow-up with PCP,  given Lokelma per ED attending in the ED with discharge for same.  Given return to ER precautions and advised to return for any worsening or concerning symptoms or should he have another near syncopal event. I ordered medication including Lokelma for hyperkalemia, IV fluids Reevaluation of the patient after these medicines showed that the patient stayed the same I have reviewed the patients home medicines and have made adjustments as needed   Social Determinants of Health:  Lives with wife, has PCP here for follow-up   Test / Admission - Considered:  Recommended admission for AKI and hyperkalemia.  On further review, these labs are not significantly changed from his prior labs with his PCP back in December.         Final Clinical Impression(s) / ED Diagnoses Final diagnoses:  Near syncope  AKI (acute kidney injury) (HCC)  Hyperkalemia    Rx / DC Orders ED Discharge Orders          Ordered    sodium zirconium cyclosilicate (LOKELMA) 5 g packet  Daily        03/20/22 1457              Jeannie Fend, PA-C 03/20/22 1814    Mancel Bale, MD 03/20/22 [phone]

## 2022-03-20 NOTE — ED Triage Notes (Signed)
Pt was at dermatologist, started getting dizzy, diaphoretic, per office BP was 70/48, pulse dropped to 35 for EMS when stood. Pt feels back to baseline

## 2022-03-21 DIAGNOSIS — N1832 Chronic kidney disease, stage 3b: Secondary | ICD-10-CM | POA: Diagnosis not present

## 2022-03-21 DIAGNOSIS — E875 Hyperkalemia: Secondary | ICD-10-CM | POA: Diagnosis not present

## 2022-03-21 DIAGNOSIS — I1310 Hypertensive heart and chronic kidney disease without heart failure, with stage 1 through stage 4 chronic kidney disease, or unspecified chronic kidney disease: Secondary | ICD-10-CM | POA: Diagnosis not present

## 2022-03-23 DIAGNOSIS — N183 Chronic kidney disease, stage 3 unspecified: Secondary | ICD-10-CM | POA: Diagnosis not present

## 2022-03-23 DIAGNOSIS — E7800 Pure hypercholesterolemia, unspecified: Secondary | ICD-10-CM | POA: Diagnosis not present

## 2022-03-23 DIAGNOSIS — I1310 Hypertensive heart and chronic kidney disease without heart failure, with stage 1 through stage 4 chronic kidney disease, or unspecified chronic kidney disease: Secondary | ICD-10-CM | POA: Diagnosis not present

## 2022-03-23 DIAGNOSIS — E1151 Type 2 diabetes mellitus with diabetic peripheral angiopathy without gangrene: Secondary | ICD-10-CM | POA: Diagnosis not present

## 2022-04-04 DIAGNOSIS — E1129 Type 2 diabetes mellitus with other diabetic kidney complication: Secondary | ICD-10-CM | POA: Diagnosis not present

## 2022-04-04 DIAGNOSIS — Z794 Long term (current) use of insulin: Secondary | ICD-10-CM | POA: Diagnosis not present

## 2022-04-04 DIAGNOSIS — Z1339 Encounter for screening examination for other mental health and behavioral disorders: Secondary | ICD-10-CM | POA: Diagnosis not present

## 2022-04-04 DIAGNOSIS — N1832 Chronic kidney disease, stage 3b: Secondary | ICD-10-CM | POA: Diagnosis not present

## 2022-04-04 DIAGNOSIS — E7800 Pure hypercholesterolemia, unspecified: Secondary | ICD-10-CM | POA: Diagnosis not present

## 2022-04-04 DIAGNOSIS — I2510 Atherosclerotic heart disease of native coronary artery without angina pectoris: Secondary | ICD-10-CM | POA: Diagnosis not present

## 2022-04-04 DIAGNOSIS — I1310 Hypertensive heart and chronic kidney disease without heart failure, with stage 1 through stage 4 chronic kidney disease, or unspecified chronic kidney disease: Secondary | ICD-10-CM | POA: Diagnosis not present

## 2022-04-04 DIAGNOSIS — E669 Obesity, unspecified: Secondary | ICD-10-CM | POA: Diagnosis not present

## 2022-04-04 DIAGNOSIS — N183 Chronic kidney disease, stage 3 unspecified: Secondary | ICD-10-CM | POA: Diagnosis not present

## 2022-04-04 DIAGNOSIS — Z1331 Encounter for screening for depression: Secondary | ICD-10-CM | POA: Diagnosis not present

## 2022-04-04 DIAGNOSIS — D692 Other nonthrombocytopenic purpura: Secondary | ICD-10-CM | POA: Diagnosis not present

## 2022-04-04 DIAGNOSIS — E1151 Type 2 diabetes mellitus with diabetic peripheral angiopathy without gangrene: Secondary | ICD-10-CM | POA: Diagnosis not present

## 2022-04-04 DIAGNOSIS — Z955 Presence of coronary angioplasty implant and graft: Secondary | ICD-10-CM | POA: Diagnosis not present

## 2022-04-16 DIAGNOSIS — Z85828 Personal history of other malignant neoplasm of skin: Secondary | ICD-10-CM | POA: Diagnosis not present

## 2022-04-16 DIAGNOSIS — L821 Other seborrheic keratosis: Secondary | ICD-10-CM | POA: Diagnosis not present

## 2022-04-16 DIAGNOSIS — L570 Actinic keratosis: Secondary | ICD-10-CM | POA: Diagnosis not present

## 2022-04-16 DIAGNOSIS — L720 Epidermal cyst: Secondary | ICD-10-CM | POA: Diagnosis not present

## 2022-04-16 DIAGNOSIS — L814 Other melanin hyperpigmentation: Secondary | ICD-10-CM | POA: Diagnosis not present

## 2022-04-16 DIAGNOSIS — L723 Sebaceous cyst: Secondary | ICD-10-CM | POA: Diagnosis not present

## 2022-04-16 DIAGNOSIS — D692 Other nonthrombocytopenic purpura: Secondary | ICD-10-CM | POA: Diagnosis not present

## 2022-05-16 DIAGNOSIS — R351 Nocturia: Secondary | ICD-10-CM | POA: Diagnosis not present

## 2022-05-16 DIAGNOSIS — N401 Enlarged prostate with lower urinary tract symptoms: Secondary | ICD-10-CM | POA: Diagnosis not present

## 2022-05-16 DIAGNOSIS — R350 Frequency of micturition: Secondary | ICD-10-CM | POA: Diagnosis not present

## 2022-08-31 DIAGNOSIS — N3943 Post-void dribbling: Secondary | ICD-10-CM | POA: Diagnosis not present

## 2022-08-31 DIAGNOSIS — N401 Enlarged prostate with lower urinary tract symptoms: Secondary | ICD-10-CM | POA: Diagnosis not present

## 2022-08-31 DIAGNOSIS — N5201 Erectile dysfunction due to arterial insufficiency: Secondary | ICD-10-CM | POA: Diagnosis not present

## 2022-08-31 DIAGNOSIS — R351 Nocturia: Secondary | ICD-10-CM | POA: Diagnosis not present

## 2022-08-31 DIAGNOSIS — R8279 Other abnormal findings on microbiological examination of urine: Secondary | ICD-10-CM | POA: Diagnosis not present

## 2022-08-31 DIAGNOSIS — N3946 Mixed incontinence: Secondary | ICD-10-CM | POA: Diagnosis not present

## 2022-09-04 ENCOUNTER — Emergency Department (HOSPITAL_BASED_OUTPATIENT_CLINIC_OR_DEPARTMENT_OTHER)
Admission: EM | Admit: 2022-09-04 | Discharge: 2022-09-04 | Disposition: A | Discharge status: Home / Self Care | Payer: PPO | Attending: Emergency Medicine | Admitting: Emergency Medicine

## 2022-09-04 ENCOUNTER — Other Ambulatory Visit: Payer: Self-pay

## 2022-09-04 ENCOUNTER — Encounter (HOSPITAL_BASED_OUTPATIENT_CLINIC_OR_DEPARTMENT_OTHER): Payer: Self-pay

## 2022-09-04 DIAGNOSIS — N281 Cyst of kidney, acquired: Secondary | ICD-10-CM | POA: Diagnosis not present

## 2022-09-04 DIAGNOSIS — R3914 Feeling of incomplete bladder emptying: Secondary | ICD-10-CM | POA: Diagnosis not present

## 2022-09-04 DIAGNOSIS — N401 Enlarged prostate with lower urinary tract symptoms: Secondary | ICD-10-CM | POA: Diagnosis not present

## 2022-09-04 DIAGNOSIS — R339 Retention of urine, unspecified: Secondary | ICD-10-CM | POA: Diagnosis not present

## 2022-09-04 DIAGNOSIS — R338 Other retention of urine: Secondary | ICD-10-CM | POA: Diagnosis not present

## 2022-09-04 HISTORY — DX: Retention of urine, unspecified: R33.9

## 2022-09-04 LAB — CBC WITH DIFFERENTIAL/PLATELET
Abs Immature Granulocytes: 0.02 10*3/uL (ref 0.00–0.07)
Basophils Absolute: 0 10*3/uL (ref 0.0–0.1)
Basophils Relative: 0 %
Eosinophils Absolute: 0.1 10*3/uL (ref 0.0–0.5)
Eosinophils Relative: 2 %
HCT: 42 % (ref 39.0–52.0)
Hemoglobin: 13.7 g/dL (ref 13.0–17.0)
Immature Granulocytes: 0 %
Lymphocytes Relative: 20 %
Lymphs Abs: 1.4 10*3/uL (ref 0.7–4.0)
MCH: 29.4 pg (ref 26.0–34.0)
MCHC: 32.6 g/dL (ref 30.0–36.0)
MCV: 90.1 fL (ref 80.0–100.0)
Monocytes Absolute: 0.8 10*3/uL (ref 0.1–1.0)
Monocytes Relative: 11 %
Neutro Abs: 4.5 10*3/uL (ref 1.7–7.7)
Neutrophils Relative %: 67 %
Platelets: 167 10*3/uL (ref 150–400)
RBC: 4.66 MIL/uL (ref 4.22–5.81)
RDW: 13.1 % (ref 11.5–15.5)
WBC: 6.8 10*3/uL (ref 4.0–10.5)
nRBC: 0 % (ref 0.0–0.2)

## 2022-09-04 LAB — BASIC METABOLIC PANEL
Anion gap: 13 (ref 5–15)
BUN: 43 mg/dL — ABNORMAL HIGH (ref 8–23)
CO2: 24 mmol/L (ref 22–32)
Calcium: 10 mg/dL (ref 8.9–10.3)
Chloride: 100 mmol/L (ref 98–111)
Creatinine, Ser: 1.95 mg/dL — ABNORMAL HIGH (ref 0.61–1.24)
GFR, Estimated: 34 mL/min — ABNORMAL LOW (ref >60)
Glucose, Bld: 185 mg/dL — ABNORMAL HIGH (ref 70–99)
Potassium: 4.6 mmol/L (ref 3.5–5.1)
Sodium: 137 mmol/L (ref 135–145)

## 2022-09-04 NOTE — ED Triage Notes (Signed)
Patient states he has not been able to avoid since lunch time.  Patient had a foley placed this morning for a procedure.  Voided bloody urine before lunch.

## 2022-09-04 NOTE — ED Notes (Signed)
Patient placed into leg bag prior to DC

## 2022-09-04 NOTE — ED Provider Notes (Signed)
[facility] DEPT Provider Note   CSN: [id number] Arrival date & time: 09/04/22  2106     History Chief Complaint  Patient presents with   Urinary Retention    HPI Jimmy Hughes is a 81 y.o. male presenting for chief complaint of urinary retention.  He states that he had a procedure this morning septostomy to evaluate for bladder injury and since procedure he has had bleeding and inability to urinate.  He denies fevers or chills, nausea vomiting, syncope shortness of breath..   Patient's recorded medical, surgical, social, medication list and allergies were reviewed in the Snapshot window as part of the initial history.   Review of Systems   Review of Systems  Constitutional:  Negative for chills and fever.  HENT:  Negative for ear pain and sore throat.   Eyes:  Negative for pain and visual disturbance.  Respiratory:  Negative for cough and shortness of breath.   Cardiovascular:  Negative for chest pain and palpitations.  Gastrointestinal:  Positive for abdominal distention and abdominal pain. Negative for vomiting.  Genitourinary:  Positive for difficulty urinating. Negative for dysuria and hematuria.  Musculoskeletal:  Negative for arthralgias and back pain.  Skin:  Negative for color change and rash.  Neurological:  Negative for seizures and syncope.  All other systems reviewed and are negative.   Physical Exam Updated Vital Signs BP (!) 181/77   Pulse 64   Temp 97.9 F (36.6 C) (Temporal)   Resp 18   Ht 6' (1.829 m)   Wt 100.2 kg   SpO2 92%   BMI 29.97 kg/m  Physical Exam Vitals and nursing note reviewed.  Constitutional:      General: He is not in acute distress.    Appearance: He is well-developed.  HENT:     Head: Normocephalic and atraumatic.  Eyes:     Conjunctiva/sclera: Conjunctivae normal.  Cardiovascular:     Rate and Rhythm: Normal rate and regular rhythm.     Heart sounds: No murmur heard. Pulmonary:     Effort:  Pulmonary effort is normal. No respiratory distress.     Breath sounds: Normal breath sounds.  Abdominal:     General: There is distension.     Palpations: Abdomen is soft.     Tenderness: There is abdominal tenderness.  Musculoskeletal:        General: No swelling.     Cervical back: Neck supple.  Skin:    General: Skin is warm and dry.     Capillary Refill: Capillary refill takes less than 2 seconds.  Neurological:     Mental Status: He is alert.  Psychiatric:        Mood and Affect: Mood normal.      ED Course/ Medical Decision Making/ A&P    Procedures Ultrasound ED Renal  Date/Time: 09/04/2022 9:30 PM  Performed by: Tretha Sciara, MD Authorized by: Tretha Sciara, MD   Procedure details:    Indications: urinary retention     Technique:  BladderImages: archived Bladder findings:    Bladder:  Visualized   Free pelvic fluid: not identified     Volume:  846m    Medications Ordered in ED Medications - No data to display  Medical Decision Making:    Jimmy SINNINGis a 81y.o. male who presented to the ED today with acute urinary retention detailed above.     Patient's presentation is complicated by their history of advanced age.  Patient placed on continuous vitals  and telemetry monitoring while in ED which was reviewed periodically.   Complete initial physical exam performed, notably the patient  was hemodynamically stable in no acute distress.      Reviewed and confirmed nursing documentation for past medical history, family history, social history.    Initial Assessment:   With the patient's presentation of acute urinary retention, most likely diagnosis is BPH versus postprocedural bleeding causing obstruction. Other diagnoses were considered including (but not limited to) infection, obstructive nephropathy. These are considered less likely due to history of present illness and physical exam findings.   This is most consistent with an acute life/limb  threatening illness complicated by underlying chronic conditions.  Initial Plan:  Point-of-care ultrasound performed as above demonstrating acute urinary retention Screening labs including CBC and Metabolic panel to evaluate for infectious or metabolic etiology of disease.  Objective evaluation as below reviewed with plan for close reassessment  Initial Study Results:   Laboratory  All laboratory results reviewed without evidence of clinically relevant pathology.     Final Assessment and Plan:   Foley catheter placed and patient to follow-up with his urologist in the outpatient setting.  No evidence of nephropathy patient in no acute distress feeling significantly improved after placement of Foley plan for outpatient follow-up with PCP and urologist.   Disposition:  I have considered need for hospitalization, however, considering all of the above, I believe this patient is stable for discharge at this time.  Patient/family educated about specific return precautions for given chief complaint and symptoms.  Patient/family educated about follow-up with PCP/urology.     Patient/family expressed understanding of return precautions and need for follow-up. Patient spoken to regarding all imaging and laboratory results and appropriate follow up for these results. All education provided in verbal form with additional information in written form. Time was allowed for answering of patient questions. Patient discharged.    Emergency Department Medication Summary:   Medications - No data to display       Clinical Impression: No diagnosis found.   Data Unavailable   Final Clinical Impression(s) / ED Diagnoses Final diagnoses:  None    Rx / DC Orders ED Discharge Orders     None         Tretha Sciara, MD 09/04/22 2315

## 2022-09-05 ENCOUNTER — Other Ambulatory Visit: Payer: Self-pay | Admitting: Urology

## 2022-09-06 ENCOUNTER — Other Ambulatory Visit: Payer: Self-pay

## 2022-09-06 ENCOUNTER — Encounter (HOSPITAL_BASED_OUTPATIENT_CLINIC_OR_DEPARTMENT_OTHER): Payer: Self-pay

## 2022-09-06 DIAGNOSIS — Z5321 Procedure and treatment not carried out due to patient leaving prior to being seen by health care provider: Secondary | ICD-10-CM | POA: Diagnosis not present

## 2022-09-06 DIAGNOSIS — R339 Retention of urine, unspecified: Secondary | ICD-10-CM | POA: Diagnosis not present

## 2022-09-06 NOTE — ED Triage Notes (Signed)
POV from home with wife, catheter placed 2 days ago, emptied approx 11am, noticed 2pm it wasn't draining well. Sts he has the urge to urinate but is unable to. Catheter in place. Scheduled for stone surgery on dec 28th. Alert and oriented x 4, amb to triage.   Bladder scan 427m+

## 2022-09-06 NOTE — ED Notes (Signed)
Leg bag unattached and urine began to flow without other interventions. Pt output 740m, leg bag reattached and cont to drain. pt sts pressure is gone, UA sent to the lab. Pt and wife placed back in the lobby.

## 2022-09-07 ENCOUNTER — Emergency Department (HOSPITAL_BASED_OUTPATIENT_CLINIC_OR_DEPARTMENT_OTHER)
Admission: EM | Admit: 2022-09-07 | Discharge: 2022-09-07 | Discharge status: Against Medical Advice | Payer: PPO | Attending: Emergency Medicine | Admitting: Emergency Medicine

## 2022-09-07 LAB — URINALYSIS, ROUTINE W REFLEX MICROSCOPIC
Bilirubin Urine: NEGATIVE
Glucose, UA: >1000 mg/dL — AB
Ketones, ur: NEGATIVE mg/dL
Leukocytes,Ua: NEGATIVE
Nitrite: NEGATIVE
Protein, ur: 100 mg/dL — AB
RBC / HPF: >50 RBC/hpf — ABNORMAL HIGH (ref 0–5)
Specific Gravity, Urine: 1.022 (ref 1.005–1.030)
Trans Epithel, UA: <1
pH: 5.5 (ref 5.0–8.0)

## 2022-09-08 LAB — URINE CULTURE: Culture: NO GROWTH

## 2022-09-10 DIAGNOSIS — R338 Other retention of urine: Secondary | ICD-10-CM | POA: Diagnosis not present

## 2022-09-10 DIAGNOSIS — N401 Enlarged prostate with lower urinary tract symptoms: Secondary | ICD-10-CM | POA: Diagnosis not present

## 2022-09-18 ENCOUNTER — Encounter (HOSPITAL_BASED_OUTPATIENT_CLINIC_OR_DEPARTMENT_OTHER): Payer: Self-pay | Admitting: Urology

## 2022-09-18 ENCOUNTER — Other Ambulatory Visit: Payer: Self-pay

## 2022-09-18 NOTE — Progress Notes (Addendum)
Patient called in and stated that his daughter or granddaughter was at his house last night. They opened presents and she spent the night at their house. She woke up this morning and tested positive for Covid, so she left immediately. The patient and his wife tested and were negative for Covid. I instructed patient to call [name] office and let them know so Dr. Claudia Desanctis could make a decision on whether to postpone surgery that is scheduled on 09/20/22 at [facility].   I spoke with [facility] @ [facility] on 09/18/22. I let her know the above information and to let Dr. Claudia Desanctis know.

## 2022-09-18 NOTE — Progress Notes (Addendum)
Spoke w/ via phone for pre-op interview---Jimmy Hughes needs dos----ISTAT, EKG (Patient had a cbc and bmp on 09/04/22 and an EKG on 03/20/22- with abnormal results.) Please ask [facility] if they want ISTAT and EKG done on day of surgery.               Hughes results------09/04/22 cbc and bmp in Epic, 03/20/22 EKG in chart & Epic COVID test -----patient states asymptomatic no test needed Arrive at -------0630 on Thursday, 09/20/2022. NPO after MN NO Solid Food.  Clear liquids from MN until---0530 Med rec completed Medications to take morning of surgery -----Lipitor, Zyrtec, Metoprolol Diabetic medication -----Hold [name] and Metformin on the morning of surgery. Patient instructed no nail polish to be worn day of surgery Patient instructed to bring photo id and insurance card day of surgery Patient aware to have Driver (ride ) / caregiver    for 24 hours after surgery - wife, [name] Patient Special Instructions -----Patient aware that partial dentures may not be worn into surgery. Pre-Op special Istructions -----none Patient verbalized understanding of instructions that were given at this phone interview. Patient denies shortness of breath, chest pain, fever, cough at this phone interview.  Patient has a hx of coronary artery bypass graft and cardioversion in 2006, HTN, HLD, DM2, and CKD 3. Medical clearance has been requested from [name] by [facility] @ [name] office on 09/05/22 and 09/15/22. Awaiting clearance. Patient stated that he takes ASA 81 mg daily. He said that per [name] instructions he had stopped five days before surgery on 09/15/22. I instructed him to call the prescribing provider, [name] for instructions.  Received ASA 81 mg clearance from [name]. Ok to stop ASA 81 mg for 5 days. Clearance placed in chart.

## 2022-09-19 NOTE — Progress Notes (Signed)
Received pt's pcp, Dr Osborne Casco medical clearance via fax from Dr Claudia Desanctis office, placed in chart.

## 2022-09-19 NOTE — H&P (Signed)
/HPI: cc: BPH with LUTs and ED   06/15/21: "Jimmy Hughes" is an 81 year old man comes in with lower urinary tract symptoms of urinary frequency, urgency and feeling of incomplete emptying. He also reports erectile dysfunction. He was referred by [facility] Silos. He has tried sildenafil in the past with somewhat of a response. He has used half of a 50 mg tab previously. He does get semi-erections but they are not firm enough for penetration or masturbation. He is on Jardiance which she thinks is impacting his urination.   08/10/21: 81 year old man with a history of BPH with LUTS and ED returns for follow-up. He was started on tamsulosin 0.4 mg nightly at his last visit. He feels like it is improved nocturia and postvoid dribbling. He does have an elevated PVR at 297. Patient does not have urinary tract infections. He is using sildenafil which does help him get an erection but is not firm as it used to be. He does not have any adverse side effects from this.   11/15/21: 81 year old man with history of BPH with LUTS and ED here for follow-up. He does have elevated PVRs however is not having UTIs. He increase tamsulosin to 0.8 mg at last visit which is helping. Nocturia has improved to 0-1 time. He is still experiencing postvoid dribbling and mild stress urinary continence. The Viagra works marginally but does not make it firm enough for penetration.   05/16/2022: 81 year old man with a history of BPH with LUTS and ED here for follow-up. He is on 0.8 mg of tamsulosin nightly. He has had persistently elevated PVRs of approximately 300 for the last several checks. He does not have any difficulty with UTIs. He is on [place]. His nighttime urination has decreased to 0-1. His biggest symptom is stress urinary continence/overflow incontinence when he moves from a sitting to standing position.   08/31/22: 81 year old man with a history of BPH with LUTS currently on 0.8 mg of tamsulosin nightly here for follow-up. He was going to  have a cystoscopy with flow rate and PVR today however his urinalysis appears infected. He was also unable to fully empty his bladder and PVR was 521 cc. He then tried to void again and repeat PVR was 493 cc. He does not feel he cannot urinate at all. In general he is sleeping through the night and feels like his urinary symptoms are improved on the tamsulosin 0.8 mg nightly. He has not had UTIs before.   09/04/22: 81 year old man with a history of BPH with LUTS currently on tamsulosin 0.8 mg nightly here for cystoscopy. Last week his urinalysis looked consistent with infection. He also has elevated PVRs of 400-500s.     ALLERGIES: Nasal Sprays    MEDICATIONS: Aspirin  Metformin Hcl 500 mg tablet  Metoprolol Succinate 50 mg tablet, extended release 24 hr  Atorvastatin Calcium 80 mg tablet  Cephalexin 500 mg capsule 1 capsule PO TID  Irbesartan 300 mg tablet  Jardiance 25 mg tablet  Tresiba Flextouch U-200     GU PSH: No GU PSH    NON-GU PSH: Coronary Artery Bypass Grafting Hernia Repair     GU PMH: BPH w/LUTS - 08/31/2022, - 05/16/2022, - 11/15/2021, - 08/10/2021, - 06/15/2021 ED due to arterial insufficiency - 08/31/2022, - 05/16/2022, - 11/15/2021, - 08/10/2021, - 06/15/2021 Mixed incontinence - 08/31/2022, - 05/16/2022 Nocturia - 08/31/2022, - 05/16/2022, - 11/15/2021, - 08/10/2021, - 06/15/2021 Post-void dribbling - 08/31/2022, - 11/15/2021 Urinary Frequency - 05/16/2022, - 11/15/2021, - 08/10/2021, - 06/15/2021 Scrotal  edema - 08/10/2021 Urinary Urgency - 08/10/2021, - 06/15/2021    NON-GU PMH: Arrhythmia Arthritis Asthma Diabetes Type 2 Hypercholesterolemia Hypertension    FAMILY HISTORY: 1 Daughter - Runs in Family 1 son - Runs in Family Cancer - Runs in Family Diverticulitis - Runs in Family   SOCIAL HISTORY: Marital Status: Married Preferred Language: English; Ethnicity: Not Hispanic Or Latino; Race: White Current Smoking Status: Patient does not smoke anymore. Smoked for 20  years. Smoked 1 pack per day.   Tobacco Use Assessment Completed: Used Tobacco in last 30 days? Drinks 2 caffeinated drinks per day.    REVIEW OF SYSTEMS:    GU Review Male:   Patient denies frequent urination, hard to postpone urination, burning/ pain with urination, get up at night to urinate, leakage of urine, stream starts and stops, trouble starting your stream, have to strain to urinate , erection problems, and penile pain.  Gastrointestinal (Upper):   Patient denies nausea, vomiting, and indigestion/ heartburn.  Gastrointestinal (Lower):   Patient denies diarrhea and constipation.  Constitutional:   Patient denies fever, night sweats, weight loss, and fatigue.  Skin:   Patient denies skin rash/ lesion and itching.  Eyes:   Patient denies blurred vision and double vision.  Ears/ Nose/ Throat:   Patient denies sore throat and sinus problems.  Hematologic/Lymphatic:   Patient denies swollen glands and easy bruising.  Cardiovascular:   Patient denies chest pains and leg swelling.  Respiratory:   Patient denies cough and shortness of breath.  Endocrine:   Patient denies excessive thirst.  Musculoskeletal:   Patient denies back pain and joint pain.  Neurological:   Patient denies headaches and dizziness.  Psychologic:   Patient denies depression and anxiety.   VITAL SIGNS: None   MULTI-SYSTEM PHYSICAL EXAMINATION:    Constitutional: Well-nourished. No physical deformities. Normally developed. Good grooming.  Neck: Neck symmetrical, not swollen. Normal tracheal position.  Respiratory: No labored breathing, no use of accessory muscles.   Skin: No paleness, no jaundice, no cyanosis. No lesion, no ulcer, no rash.  Neurologic / Psychiatric: Oriented to time, oriented to place, oriented to person. No depression, no anxiety, no agitation.  Eyes: Normal conjunctivae. Normal eyelids.  Ears, Nose, Mouth, and Throat: Left ear no scars, no lesions, no masses. Right ear no scars, no lesions, no  masses. Nose no scars, no lesions, no masses. Normal hearing. Normal lips.  Musculoskeletal: Normal gait and station of head and neck.     Complexity of Data:  Records Review:   Previous Patient Records  Urine Test Review:   Urinalysis  Urodynamics Review:   Review Bladder Scan, Review Flow Rate   PROCEDURES:         Flexible Cystoscopy - 52000  Risks, benefits, and some of the potential complications of the procedure were discussed at length with the patient including infection, bleeding, voiding discomfort, urinary retention, fever, chills, sepsis, and others. All questions were answered. Informed consent was obtained. Sterile technique and intraurethral analgesia were used.  Meatus:  Normal size. Normal location. Normal condition.  Urethra:  No strictures.  External Sphincter:  Normal.  Verumontanum:  Normal.  Prostate:  Obstructing lateral lobes, no intravescial median lobe  Bladder Neck:  Non-obstructing.  Ureteral Orifices:  Normal location. Normal size. Normal shape. Effluxed clear urine.  Bladder:  Moderate trabeculation. No tumors. Normal mucosa. 3-4 calculi approx 1 cm. Increased bladder capacity.      The lower urinary tract was carefully examined. The procedure was well-tolerated and  without complications. Antibiotic instructions were given. Instructions were given to call the office immediately for bloody urine, difficulty urinating, urinary retention, painful or frequent urination, fever, chills, nausea, vomiting or other illness. The patient stated that he understood these instructions and would comply with them.          Renal Ultrasound - 24268  Right Kidney: Length: 11.24 cm Depth: 5.45 cm Cortical Width: 1.31 cm Width: 6.20 cm  Left Kidney: Length: 10.63 cm Depth: 3.53 cm Cortical Width: 0.92 cm Width: 4.77 cm  Left Kidney/Ureter:  1)Atrophic Appearance-----2)Upper Pole Cystic Area measuring 4.52cm x 3.25cm x 4.33cm-------3)1.14cm Lower Pole Cystic Area----4)  Subcentimeter hyperechoic areas with and without shadowing largest in [employer] measuring 0.44cm- ? stones vs vascular calcifications   Right Kidney/Ureter:  1)Lower Pole Cystic Area measuring 3.23cm x 2.92cm x 3.36cm-------2)Lower Pole Cystic Area measuring 1.51cm x 1.14cm x 1.19cm  Bladder:  PVR = 697.93m--------? Area in Bladder vs Debris      Patient confirmed No Neulasta OnPro Device.          Flow Rate - 51741  Flow Time: :22 min:sec  Peak Flow Rate: 3 cc/sec  Voided Volume: 68 cc  Total Void Time: :20 min:sec  Average Flow Rate: 4 cc/sec           PVR Ultrasound - 51798  Scanned Volume: 438 cc         Urinalysis w/Scope - 81001 Dipstick Dipstick Cont'd Micro  Specimen: Voided Bilirubin: Neg WBC/hpf: NS (Not Seen)  Color: Yellow Ketones: Neg RBC/hpf: NS (Not Seen)  Appearance: Clear Blood: Neg Bacteria: NS (Not Seen)  Specific Gravity: 1.020 Protein: 2+ Cystals: Amorph Urates  pH: 5.5 Urobilinogen: 0.2 Casts: NS (Not Seen)  Glucose: Neg Nitrites: Neg Trichomonas: Not Present    Leukocyte Esterase: Neg Mucous: Present      Epithelial Cells: 0 - 5/hpf      Yeast: NS (Not Seen)      Sperm: Not Present    ASSESSMENT:      ICD-10 Details  1 GU:   BPH w/LUTS - N40.1 Chronic, Worsening  2   Incomplete bladder emptying - R39.14 Chronic, Worsening  3   Urinary Retention - RT41.9Acute, Uncomplicated  4   Bladder Stone - N21.0 Chronic, Stable  5   Renal cyst - N28.1 Chronic, Stable   PLAN:           Orders X-Rays: Renal Ultrasound          Schedule         Document Letter(s):  Created for Patient: Clinical Summary         Notes:   1. BPH with LUTs/BOO  2. Bladder calculi  3. Renal cysts   Discussed results of cystoscopy which showed bladder calculi and BPH. We talked about patient's urinary retention/incomplete emptying and that he would benefit from a bladder outlet procedure as well as removal of stones to help reduce infection risk. I discussed proceeding  with a cystoscopy lithotripsy and TURP. Risks and benefits the procedure discussed with the patient in detail including but not limited to bleeding, infection, pain, damage to surrounding structures, need for additional intervention, retrograde ejaculation. Schedule for the next available date. In the interim I recommended timed and double voiding. He will continue on tamsulosin 0.8 mg nightly.

## 2022-09-19 NOTE — Anesthesia Preprocedure Evaluation (Signed)
Anesthesia Evaluation  Patient identified by MRN, date of birth, ID band Patient awake    Reviewed: Allergy & Precautions, H&P , NPO status , Patient's Chart, lab work & pertinent test results  Airway Mallampati: II  TM Distance: >3 FB Neck ROM: Full    Dental no notable dental hx. (+) Poor Dentition, Missing, Dental Advisory Given, Partial Upper, Partial Lower   Pulmonary neg pulmonary ROS, former smoker   Pulmonary exam normal breath sounds clear to auscultation       Cardiovascular hypertension, + CAD  negative cardio ROS Normal cardiovascular exam Rhythm:Regular Rate:Normal     Neuro/Psych negative neurological ROS  negative psych ROS   GI/Hepatic negative GI ROS, Neg liver ROS,GERD  ,,  Endo/Other  negative endocrine ROSdiabetes, Type 2    Renal/GU CRFRenal diseasenegative Renal ROS  negative genitourinary   Musculoskeletal negative musculoskeletal ROS (+) Arthritis ,    Abdominal   Peds negative pediatric ROS (+)  Hematology negative hematology ROS (+)   Anesthesia Other Findings   Reproductive/Obstetrics negative OB ROS                             Anesthesia Physical Anesthesia Plan  ASA: 3  Anesthesia Plan: General   Post-op Pain Management: Ofirmev IV (intra-op)*   Induction: Intravenous  PONV Risk Score and Plan: 2 and Ondansetron, Dexamethasone and Treatment may vary due to age or medical condition  Airway Management Planned: LMA and Oral ETT  Additional Equipment: None  Intra-op Plan:   Post-operative Plan: Extubation in OR  Informed Consent:      Dental advisory given  Plan Discussed with: CRNA and Anesthesiologist  Anesthesia Plan Comments: ( )       Anesthesia Quick Evaluation

## 2022-09-20 ENCOUNTER — Ambulatory Visit (HOSPITAL_BASED_OUTPATIENT_CLINIC_OR_DEPARTMENT_OTHER)
Admission: RE | Admit: 2022-09-20 | Discharge: 2022-09-21 | Disposition: A | Discharge status: Home / Self Care | Payer: PPO | Attending: Urology | Admitting: Urology

## 2022-09-20 ENCOUNTER — Ambulatory Visit (HOSPITAL_BASED_OUTPATIENT_CLINIC_OR_DEPARTMENT_OTHER): Payer: PPO | Admitting: Anesthesiology

## 2022-09-20 ENCOUNTER — Encounter (HOSPITAL_BASED_OUTPATIENT_CLINIC_OR_DEPARTMENT_OTHER)
Admission: RE | Disposition: A | Discharge status: Home / Self Care | Payer: Self-pay | Source: Home / Self Care | Attending: Urology

## 2022-09-20 ENCOUNTER — Encounter (HOSPITAL_BASED_OUTPATIENT_CLINIC_OR_DEPARTMENT_OTHER): Payer: Self-pay | Admitting: Urology

## 2022-09-20 ENCOUNTER — Other Ambulatory Visit: Payer: Self-pay

## 2022-09-20 DIAGNOSIS — I10 Essential (primary) hypertension: Secondary | ICD-10-CM | POA: Diagnosis not present

## 2022-09-20 DIAGNOSIS — Z7984 Long term (current) use of oral hypoglycemic drugs: Secondary | ICD-10-CM | POA: Diagnosis not present

## 2022-09-20 DIAGNOSIS — Z87891 Personal history of nicotine dependence: Secondary | ICD-10-CM

## 2022-09-20 DIAGNOSIS — E1122 Type 2 diabetes mellitus with diabetic chronic kidney disease: Secondary | ICD-10-CM | POA: Diagnosis not present

## 2022-09-20 DIAGNOSIS — Z01818 Encounter for other preprocedural examination: Secondary | ICD-10-CM

## 2022-09-20 DIAGNOSIS — N3943 Post-void dribbling: Secondary | ICD-10-CM | POA: Diagnosis not present

## 2022-09-20 DIAGNOSIS — N189 Chronic kidney disease, unspecified: Secondary | ICD-10-CM | POA: Diagnosis not present

## 2022-09-20 DIAGNOSIS — N529 Male erectile dysfunction, unspecified: Secondary | ICD-10-CM | POA: Diagnosis not present

## 2022-09-20 DIAGNOSIS — I2510 Atherosclerotic heart disease of native coronary artery without angina pectoris: Secondary | ICD-10-CM | POA: Insufficient documentation

## 2022-09-20 DIAGNOSIS — N400 Enlarged prostate without lower urinary tract symptoms: Secondary | ICD-10-CM

## 2022-09-20 DIAGNOSIS — E119 Type 2 diabetes mellitus without complications: Secondary | ICD-10-CM | POA: Insufficient documentation

## 2022-09-20 DIAGNOSIS — N320 Bladder-neck obstruction: Secondary | ICD-10-CM | POA: Diagnosis not present

## 2022-09-20 DIAGNOSIS — N210 Calculus in bladder: Secondary | ICD-10-CM

## 2022-09-20 DIAGNOSIS — N342 Other urethritis: Secondary | ICD-10-CM | POA: Insufficient documentation

## 2022-09-20 DIAGNOSIS — N3080 Other cystitis without hematuria: Secondary | ICD-10-CM | POA: Diagnosis not present

## 2022-09-20 DIAGNOSIS — N3090 Cystitis, unspecified without hematuria: Secondary | ICD-10-CM | POA: Diagnosis not present

## 2022-09-20 DIAGNOSIS — N410 Acute prostatitis: Secondary | ICD-10-CM | POA: Diagnosis not present

## 2022-09-20 DIAGNOSIS — R338 Other retention of urine: Secondary | ICD-10-CM | POA: Insufficient documentation

## 2022-09-20 DIAGNOSIS — I129 Hypertensive chronic kidney disease with stage 1 through stage 4 chronic kidney disease, or unspecified chronic kidney disease: Secondary | ICD-10-CM

## 2022-09-20 DIAGNOSIS — N281 Cyst of kidney, acquired: Secondary | ICD-10-CM | POA: Insufficient documentation

## 2022-09-20 DIAGNOSIS — N411 Chronic prostatitis: Secondary | ICD-10-CM | POA: Insufficient documentation

## 2022-09-20 DIAGNOSIS — N401 Enlarged prostate with lower urinary tract symptoms: Secondary | ICD-10-CM | POA: Insufficient documentation

## 2022-09-20 DIAGNOSIS — N138 Other obstructive and reflux uropathy: Secondary | ICD-10-CM | POA: Diagnosis present

## 2022-09-20 HISTORY — DX: Presence of dental prosthetic device (complete) (partial): Z97.2

## 2022-09-20 HISTORY — DX: Chronic kidney disease, unspecified: N18.9

## 2022-09-20 HISTORY — PX: TRANSURETHRAL RESECTION OF PROSTATE: SHX73

## 2022-09-20 HISTORY — DX: Gastro-esophageal reflux disease without esophagitis: K21.9

## 2022-09-20 LAB — POCT I-STAT, CHEM 8
BUN: 38 mg/dL — ABNORMAL HIGH (ref 8–23)
Calcium, Ion: 1.34 mmol/L (ref 1.15–1.40)
Chloride: 105 mmol/L (ref 98–111)
Creatinine, Ser: 2 mg/dL — ABNORMAL HIGH (ref 0.61–1.24)
Glucose, Bld: 85 mg/dL (ref 70–99)
HCT: 45 % (ref 39.0–52.0)
Hemoglobin: 15.3 g/dL (ref 13.0–17.0)
Potassium: 4.3 mmol/L (ref 3.5–5.1)
Sodium: 140 mmol/L (ref 135–145)
TCO2: 26 mmol/L (ref 22–32)

## 2022-09-20 LAB — GLUCOSE, CAPILLARY
Glucose-Capillary: 100 mg/dL — ABNORMAL HIGH (ref 70–99)
Glucose-Capillary: 197 mg/dL — ABNORMAL HIGH (ref 70–99)
Glucose-Capillary: 141 mg/dL — ABNORMAL HIGH (ref 70–99)

## 2022-09-20 SURGERY — TURP (TRANSURETHRAL RESECTION OF PROSTATE)
Anesthesia: General | Site: Prostate

## 2022-09-20 MED ORDER — PHENYLEPHRINE 80 MCG/ML (10ML) SYRINGE FOR IV PUSH (FOR BLOOD PRESSURE SUPPORT)
PREFILLED_SYRINGE | INTRAVENOUS | Status: DC | PRN
  Administered 2022-09-20: 160 ug via INTRAVENOUS
  Administered 2022-09-20 (×2): 80 ug via INTRAVENOUS
  Administered 2022-09-20: 160 ug via INTRAVENOUS
  Administered 2022-09-20: 80 ug via INTRAVENOUS

## 2022-09-20 MED ORDER — CEFAZOLIN SODIUM-DEXTROSE 2-4 GM/100ML-% IV SOLN
2.0000 g | INTRAVENOUS | Status: AC
  Administered 2022-09-20: 2 g via INTRAVENOUS

## 2022-09-20 MED ORDER — SODIUM CHLORIDE 0.9 % IR SOLN
Status: DC | PRN
  Administered 2022-09-20 (×11): 3000 mL

## 2022-09-20 MED ORDER — CEFAZOLIN SODIUM-DEXTROSE 2-4 GM/100ML-% IV SOLN
INTRAVENOUS | Status: AC
  Filled 2022-09-20: qty 100

## 2022-09-20 MED ORDER — SENNOSIDES-DOCUSATE SODIUM 8.6-50 MG PO TABS
2.0000 | ORAL_TABLET | Freq: Every day | ORAL | Status: DC
  Administered 2022-09-20: 2 via ORAL
  Filled 2022-09-20: qty 2

## 2022-09-20 MED ORDER — ACETAMINOPHEN 160 MG/5ML PO SOLN: 325.0000 mg | ORAL | Status: DC | PRN

## 2022-09-20 MED ORDER — PROPOFOL 10 MG/ML IV BOLUS
INTRAVENOUS | Status: AC
  Filled 2022-09-20: qty 20

## 2022-09-20 MED ORDER — TAMSULOSIN HCL 0.4 MG PO CAPS
0.4000 mg | ORAL_CAPSULE | Freq: Every day | ORAL | Status: DC
  Administered 2022-09-20: 0.4 mg via ORAL

## 2022-09-20 MED ORDER — METFORMIN HCL 500 MG PO TABS
1000.0000 mg | ORAL_TABLET | Freq: Every day | ORAL | Status: DC
  Administered 2022-09-21: 1000 mg via ORAL
  Filled 2022-09-20: qty 2

## 2022-09-20 MED ORDER — ONDANSETRON HCL 4 MG/2ML IJ SOLN
INTRAMUSCULAR | Status: DC | PRN
  Administered 2022-09-20: 4 mg via INTRAVENOUS

## 2022-09-20 MED ORDER — EMPAGLIFLOZIN 25 MG PO TABS
25.0000 mg | ORAL_TABLET | Freq: Every day | ORAL | Status: DC
  Administered 2022-09-20: 25 mg via ORAL
  Filled 2022-09-20: qty 1

## 2022-09-20 MED ORDER — OXYCODONE HCL 5 MG/5ML PO SOLN: 5.0000 mg | Freq: Once | ORAL | Status: DC | PRN

## 2022-09-20 MED ORDER — SODIUM CHLORIDE 0.9 % IR SOLN
3000.0000 mL | Status: DC
  Administered 2022-09-20 – 2022-09-21 (×3): 3000 mL

## 2022-09-20 MED ORDER — LIDOCAINE 2% (20 MG/ML) 5 ML SYRINGE
INTRAMUSCULAR | Status: DC | PRN
  Administered 2022-09-20: 50 mg via INTRAVENOUS

## 2022-09-20 MED ORDER — MORPHINE SULFATE (PF) 4 MG/ML IV SOLN: 2.0000 mg | INTRAVENOUS | Status: DC | PRN

## 2022-09-20 MED ORDER — ACETAMINOPHEN 325 MG PO TABS: 325.0000 mg | ORAL_TABLET | ORAL | Status: DC | PRN

## 2022-09-20 MED ORDER — SODIUM CHLORIDE 0.9% FLUSH: 3.0000 mL | Freq: Two times a day (BID) | INTRAVENOUS | Status: DC

## 2022-09-20 MED ORDER — EPHEDRINE SULFATE-NACL 50-0.9 MG/10ML-% IV SOSY
PREFILLED_SYRINGE | INTRAVENOUS | Status: DC | PRN
  Administered 2022-09-20 (×3): 5 mg via INTRAVENOUS
  Administered 2022-09-20 (×2): 10 mg via INTRAVENOUS

## 2022-09-20 MED ORDER — ACETAMINOPHEN 325 MG PO TABS
650.0000 mg | ORAL_TABLET | ORAL | Status: DC | PRN
  Administered 2022-09-20 – 2022-09-21 (×2): 650 mg via ORAL

## 2022-09-20 MED ORDER — TAMSULOSIN HCL 0.4 MG PO CAPS
ORAL_CAPSULE | ORAL | Status: AC
  Filled 2022-09-20: qty 1

## 2022-09-20 MED ORDER — SODIUM CHLORIDE 0.9 % IV SOLN: INTRAVENOUS | Status: DC

## 2022-09-20 MED ORDER — DIPHENHYDRAMINE HCL 50 MG/ML IJ SOLN
12.5000 mg | Freq: Four times a day (QID) | INTRAMUSCULAR | Status: DC | PRN
  Administered 2022-09-21: 12.5 mg via INTRAVENOUS

## 2022-09-20 MED ORDER — 0.9 % SODIUM CHLORIDE (POUR BTL) OPTIME
TOPICAL | Status: DC | PRN
  Administered 2022-09-20: 500 mL

## 2022-09-20 MED ORDER — FENTANYL CITRATE (PF) 250 MCG/5ML IJ SOLN
INTRAMUSCULAR | Status: DC | PRN
  Administered 2022-09-20: 25 ug via INTRAVENOUS
  Administered 2022-09-20: 50 ug via INTRAVENOUS
  Administered 2022-09-20: 25 ug via INTRAVENOUS

## 2022-09-20 MED ORDER — OXYCODONE-ACETAMINOPHEN 5-325 MG PO TABS: 1.0000 | ORAL_TABLET | ORAL | Status: DC | PRN

## 2022-09-20 MED ORDER — MEPERIDINE HCL 25 MG/ML IJ SOLN: 6.2500 mg | INTRAMUSCULAR | Status: DC | PRN

## 2022-09-20 MED ORDER — METOPROLOL TARTRATE 25 MG PO TABS
ORAL_TABLET | ORAL | Status: AC
  Filled 2022-09-20: qty 2

## 2022-09-20 MED ORDER — ONDANSETRON HCL 4 MG/2ML IJ SOLN: 4.0000 mg | INTRAMUSCULAR | Status: DC | PRN

## 2022-09-20 MED ORDER — INSULIN ASPART 100 UNIT/ML IJ SOLN
0.0000 [IU] | Freq: Three times a day (TID) | INTRAMUSCULAR | Status: DC
  Administered 2022-09-20: 3 [IU] via SUBCUTANEOUS

## 2022-09-20 MED ORDER — FENTANYL CITRATE (PF) 100 MCG/2ML IJ SOLN: 25.0000 ug | INTRAMUSCULAR | Status: DC | PRN

## 2022-09-20 MED ORDER — SODIUM CHLORIDE 0.9 % IV SOLN: 250.0000 mL | INTRAVENOUS | Status: DC | PRN

## 2022-09-20 MED ORDER — IRBESARTAN 300 MG PO TABS
300.0000 mg | ORAL_TABLET | Freq: Every day | ORAL | Status: DC
  Administered 2022-09-20: 300 mg via ORAL
  Filled 2022-09-20: qty 1

## 2022-09-20 MED ORDER — DIPHENHYDRAMINE HCL 12.5 MG/5ML PO ELIX: 12.5000 mg | ORAL_SOLUTION | Freq: Four times a day (QID) | ORAL | Status: DC | PRN

## 2022-09-20 MED ORDER — ACETAMINOPHEN 325 MG PO TABS
ORAL_TABLET | ORAL | Status: AC
  Filled 2022-09-20: qty 2

## 2022-09-20 MED ORDER — INSULIN ASPART 100 UNIT/ML IJ SOLN
INTRAMUSCULAR | Status: AC
  Filled 2022-09-20: qty 1

## 2022-09-20 MED ORDER — SODIUM CHLORIDE 0.9% FLUSH: 3.0000 mL | INTRAVENOUS | Status: DC | PRN

## 2022-09-20 MED ORDER — OXYBUTYNIN CHLORIDE 5 MG PO TABS: 5.0000 mg | ORAL_TABLET | Freq: Three times a day (TID) | ORAL | Status: DC | PRN

## 2022-09-20 MED ORDER — ATORVASTATIN CALCIUM 80 MG PO TABS
80.0000 mg | ORAL_TABLET | Freq: Every day | ORAL | Status: DC
  Filled 2022-09-20: qty 1

## 2022-09-20 MED ORDER — METOPROLOL TARTRATE 25 MG PO TABS
50.0000 mg | ORAL_TABLET | Freq: Two times a day (BID) | ORAL | Status: DC
  Administered 2022-09-20: 50 mg via ORAL

## 2022-09-20 MED ORDER — PROPOFOL 10 MG/ML IV BOLUS
INTRAVENOUS | Status: DC | PRN
  Administered 2022-09-20: 120 mg via INTRAVENOUS

## 2022-09-20 MED ORDER — ONDANSETRON HCL 4 MG/2ML IJ SOLN: 4.0000 mg | Freq: Once | INTRAMUSCULAR | Status: DC | PRN

## 2022-09-20 MED ORDER — FENTANYL CITRATE (PF) 100 MCG/2ML IJ SOLN
INTRAMUSCULAR | Status: AC
  Filled 2022-09-20: qty 2

## 2022-09-20 MED ORDER — DEXTROSE-NACL 5-0.45 % IV SOLN: INTRAVENOUS | Status: DC

## 2022-09-20 MED ORDER — INSULIN ASPART 100 UNIT/ML IJ SOLN: 0.0000 [IU] | Freq: Every day | INTRAMUSCULAR | Status: DC

## 2022-09-20 MED ORDER — OXYCODONE HCL 5 MG PO TABS: 5.0000 mg | ORAL_TABLET | Freq: Once | ORAL | Status: DC | PRN

## 2022-09-20 MED ORDER — TRIPLE ANTIBIOTIC 3.5-400-5000 EX OINT: 1.0000 | TOPICAL_OINTMENT | Freq: Three times a day (TID) | CUTANEOUS | Status: DC | PRN

## 2022-09-20 MED ORDER — LORATADINE 10 MG PO TABS: 10.0000 mg | ORAL_TABLET | Freq: Every day | ORAL | Status: DC

## 2022-09-20 SURGICAL SUPPLY — 21 items
BAG DRAIN URO-CYSTO SKYTR STRL (DRAIN) ×2 IMPLANT
BAG DRN RND TRDRP ANRFLXCHMBR (UROLOGICAL SUPPLIES) ×2
BAG DRN UROCATH (DRAIN) ×2
BAG URINE DRAIN 2000ML AR STRL (UROLOGICAL SUPPLIES) ×2 IMPLANT
CATH FOLEY 3WAY 30CC 22FR (CATHETERS) ×1 IMPLANT
CATH HEMA 3WAY 30CC 22FR COUDE (CATHETERS) ×2 IMPLANT
CLOTH BEACON ORANGE TIMEOUT ST (SAFETY) ×2 IMPLANT
GLOVE BIO SURGEON STRL SZ 6.5 (GLOVE) ×2 IMPLANT
GLOVE BIOGEL PI IND STRL 7.0 (GLOVE) ×2 IMPLANT
GOWN STRL REUS W/TWL LRG LVL3 (GOWN DISPOSABLE) ×3 IMPLANT
HOLDER FOLEY CATH W/STRAP (MISCELLANEOUS) ×2 IMPLANT
IV NS IRRIG 3000ML ARTHROMATIC (IV SOLUTION) ×13 IMPLANT
KIT TURNOVER CYSTO (KITS) ×2 IMPLANT
LOOP CUT BIPOLAR 24F LRG (ELECTROSURGICAL) ×1 IMPLANT
MANIFOLD NEPTUNE II (INSTRUMENTS) ×3 IMPLANT
NS IRRIG 500ML POUR BTL (IV SOLUTION) ×1 IMPLANT
PACK CYSTO (CUSTOM PROCEDURE TRAY) ×2 IMPLANT
SYR TOOMEY IRRIG 70ML (MISCELLANEOUS) ×2
SYRINGE TOOMEY IRRIG 70ML (MISCELLANEOUS) ×2 IMPLANT
TUBE CONNECTING 12X1/4 (SUCTIONS) ×2 IMPLANT
TUBING UROLOGY SET (TUBING) ×3 IMPLANT

## 2022-09-20 NOTE — Interval H&P Note (Signed)
History and Physical Interval Note:  09/20/2022 8:08 AM  Jimmy Hughes  has presented today for surgery, with the diagnosis of BLADDER STONE, BENIGN PROSTATIC HYPERPLASIA.  The various methods of treatment have been discussed with the patient and family. After consideration of risks, benefits and other options for treatment, the patient has consented to  Procedure(s): TRANSURETHRAL RESECTION OF THE PROSTATE (TURP) (N/A) CYSTOSCOPY WITH LITHOLAPAXY (N/A) HOLMIUM LASER APPLICATION (N/A) as a surgical intervention.  The patient's history has been reviewed, patient examined, no change in status, stable for surgery.  I have reviewed the patient's chart and labs.  Questions were answered to the patient's satisfaction.     [name] D Glade Strausser

## 2022-09-20 NOTE — Anesthesia Procedure Notes (Signed)
Procedure Name: LMA Insertion Date/Time: 09/20/2022 8:18 AM  Performed by: Clearnce Sorrel, CRNAPre-anesthesia Checklist: Patient identified, Emergency Drugs available, Suction available and Patient being monitored Patient Re-evaluated:Patient Re-evaluated prior to induction Oxygen Delivery Method: Circle System Utilized Preoxygenation: Pre-oxygenation with 100% oxygen Induction Type: IV induction Ventilation: Mask ventilation without difficulty LMA: LMA inserted LMA Size: 5.0 Number of attempts: 1 Airway Equipment and Method: Bite block Placement Confirmation: positive ETCO2 Tube secured with: Tape Dental Injury: Teeth and Oropharynx as per pre-operative assessment

## 2022-09-20 NOTE — Discharge Instructions (Signed)

## 2022-09-20 NOTE — Transfer of Care (Signed)
Immediate Anesthesia Transfer of Care Note  Patient: Jimmy Hughes  Procedure(s) Performed: TRANSURETHRAL RESECTION OF THE PROSTATE (TURP) (Prostate)  Patient Location: PACU  Anesthesia Type:General  Level of Consciousness: awake, alert , and oriented  Airway & Oxygen Therapy: Patient Spontanous Breathing  Post-op Assessment: Report given to RN and Post -op Vital signs reviewed and stable  Post vital signs: Reviewed and stable  Last Vitals:  Vitals Value Taken Time  BP    Temp    Pulse 92 09/20/22 0945  Resp 20 09/20/22 0945  SpO2 94 % 09/20/22 0945  Vitals shown include unvalidated device data.  Last Pain:  Vitals:   09/20/22 0705  TempSrc: Oral  PainSc: 0-No pain      Patients Stated Pain Goal: 6 (48/47/20 7218)  Complications: No notable events documented.

## 2022-09-20 NOTE — Progress Notes (Signed)
Report received from PACU RN that pt had O2 sat in high 80s on RA. ISP initiated, O2 White 2L applied with O2 sat increased to mid 90s. HR WNL, HR irregular with PACs on PACU monitor. Pt has PMH of Afib. Pt has recent family exposure to COVID, all tests for patient have been negative. PACU RN notified Dr. Ambrose Pancoast with anesthesia of concerns d/t irregular HR, decreased O2 sats and need for O2 [place], MD states he will check on patient later in [place].  Upon arrival to [place], VSS WNL on 2L O2 [place], ISP performed, radial pulse noted to be irregular upon palpation, pt asymptomatic. Pt states at times when he is "in Afib in the past I can't tell it" and has been asymptomatic. Will continue to monitor. Wife at bedside.   Lyndel Pleasure, RN

## 2022-09-20 NOTE — Op Note (Signed)
Preoperative diagnosis: Bladder outlet obstruction secondary to BPH  Postoperative diagnosis:  Bladder outlet obstruction secondary to BPH  Procedure:  Cystoscopy Transurethral resection of the prostate  Surgeon: Jacalyn Lefevre, MD  Anesthesia: General  Complications: None  EBL: Minimal  Specimens: Prostate chips  Indication: Jimmy Hughes is a patient with bladder outlet obstruction secondary to benign prostatic hyperplasia. After reviewing the management options for treatment, he elected to proceed with the above surgical procedure(s). We have discussed the potential benefits and risks of the procedure, side effects of the proposed treatment, the likelihood of the patient achieving the goals of the procedure, and any potential problems that might occur during the procedure or recuperation. Informed consent has been obtained.  Description of procedure:  The patient was taken to the operating room and general anesthesia was induced.  The patient was placed in the dorsal lithotomy position, prepped and draped in the usual sterile fashion, and preoperative antibiotics were administered. A preoperative time-out was performed.   Cystourethroscopy was performed.  The patient's urethra was examined and demonstrated bilobar prostatic hypertrophy.   The bladder was then systematically examined in its entirety. There was no evidence of any bladder tumors, stones, or other mucosal pathology.bThe ureteral orifices were identified and marked so as to be avoided during the procedure.  The prostate adenoma was then resected utilizing loop cautery resection with the bipolar cutting loop.  The prostate adenoma from the bladder neck back to the verumontanum was resected beginning at the six o'clock position and then extended to include the right and left lobes of the prostate and anterior prostate. Care was taken not to resect distal to the verumontanum.   Hemostasis was then achieved with the  cautery and the bladder was emptied and reinspected with no significant bleeding noted at the end of the procedure.    A 22Fr 3-way hematuria catheter was then placed into the bladder.  The patient appeared to tolerate the procedure well and without complications.  The patient was able to be awakened and transferred to the recovery unit in satisfactory condition.

## 2022-09-21 ENCOUNTER — Encounter (HOSPITAL_BASED_OUTPATIENT_CLINIC_OR_DEPARTMENT_OTHER): Payer: Self-pay | Admitting: Urology

## 2022-09-21 DIAGNOSIS — N401 Enlarged prostate with lower urinary tract symptoms: Secondary | ICD-10-CM | POA: Diagnosis not present

## 2022-09-21 LAB — GLUCOSE, CAPILLARY: Glucose-Capillary: 107 mg/dL — ABNORMAL HIGH (ref 70–99)

## 2022-09-21 LAB — SURGICAL PATHOLOGY

## 2022-09-21 MED ORDER — DIPHENHYDRAMINE HCL 50 MG/ML IJ SOLN
INTRAMUSCULAR | Status: AC
  Filled 2022-09-21: qty 1

## 2022-09-21 MED ORDER — CEPHALEXIN 250 MG PO CAPS: 250.0000 mg | ORAL_CAPSULE | Freq: Every day | ORAL | 0 refills | Status: AC

## 2022-09-21 MED ORDER — ACETAMINOPHEN 325 MG PO TABS
ORAL_TABLET | ORAL | Status: AC
  Filled 2022-09-21: qty 2

## 2022-09-21 NOTE — Anesthesia Postprocedure Evaluation (Signed)
Anesthesia Post Note  Patient: Jimmy Hughes  Procedure(s) Performed: TRANSURETHRAL RESECTION OF THE PROSTATE (TURP) (Prostate)     Patient location during evaluation: PACU Anesthesia Type: General Level of consciousness: awake and alert Pain management: pain level controlled Vital Signs Assessment: post-procedure vital signs reviewed and stable Respiratory status: spontaneous breathing, nonlabored ventilation, respiratory function stable and patient connected to nasal cannula oxygen Cardiovascular status: blood pressure returned to baseline and stable Postop Assessment: no apparent nausea or vomiting Anesthetic complications: no  No notable events documented.  Last Vitals:  Vitals:   09/21/22 0615 09/21/22 0808  BP: 139/75 (!) 153/76  Pulse: 67   Resp: 20 20  Temp: 36.8 C 36.9 C  SpO2: 97% 97%    Last Pain:  Vitals:   09/21/22 0808  TempSrc:   PainSc: 0-No pain                 Eliany Mccarter

## 2022-09-21 NOTE — Discharge Summary (Signed)
Date of admission: 09/20/2022  Date of discharge: 09/21/2022  Admission diagnosis: BPH with BOO  Discharge diagnosis: BPH with BOO  Secondary diagnoses:  Patient Active Problem List   Diagnosis Date Noted   BPH with obstruction/lower urinary tract symptoms 09/20/2022   CAD (coronary artery disease)    Hypercholesteremia    Diabetes mellitus    Hypertension    Atrial flutter ([place])     Procedures performed: Procedure(s): TRANSURETHRAL RESECTION OF THE PROSTATE (TURP)  History and Physical: For full details, please see admission history and physical. Briefly, Jimmy Hughes is a 81 y.o. year old patient with BPH with BOO and urinary retention.  He underwent TURP on 09/20/22.  Gen: NAD, alert and oriented Resp: nonlabored respirations GU 22Fr 3 way foley with very slow drip, light pink in tubing, no clots   Hospital Course: Patient tolerated the procedure well.  He was then transferred to the floor after an uneventful PACU stay.  His hospital course was uncomplicated.  On POD#1 he had met discharge criteria: was eating a regular diet, was up and ambulating independently,  pain was well controlled, CBI was weaned, and was ready to for discharge.   Laboratory values:  Recent Labs    09/20/22 0725  HGB 15.3  HCT 45.0   Recent Labs    09/20/22 0725  NA 140  K 4.3  CL 105  GLUCOSE 85  BUN 38*  CREATININE 2.00*   No results for input(s): "LABPT", "INR" in the last 72 hours. No results for input(s): "LABURIN" in the last 72 hours. Results for orders placed or performed during the hospital encounter of 09/07/22  Urine Culture     Status: None   Collection Time: 09/06/22 11:39 PM   Specimen: Urine, Catheterized  Result Value Ref Range Status   Specimen Description   Final    URINE, CATHETERIZED Performed at [facility], [address] [place], [place] [address]    Special Requests   Final    NONE Performed at [facility], [address], [place], [place] [address]    Culture   Final    NO GROWTH Performed at [facility], [place] [address], [place], [place] [address]    Report Status 09/08/2022 FINAL  Final    Disposition: Home  Discharge instruction: The patient was instructed to be ambulatory but told to refrain from heavy lifting, strenuous activity, or driving.   Discharge medications:  Allergies as of 09/21/2022   No Known Allergies      Medication List     TAKE these medications    acetaminophen 325 MG tablet Commonly known as: TYLENOL Take 650 mg by mouth every 6 (six) hours as needed.   aspirin 81 MG tablet Take 81 mg by mouth daily.  Pt stopped ASA on 09/15/22. He stopped per instructions from Dr. Claudia Desanctis to stop for 5 days prior.   atorvastatin 80 MG tablet Commonly known as: LIPITOR Take 80 mg by mouth daily.   cephALEXin 250 MG capsule Commonly known as: KEFLEX Take 1 capsule (250 mg total) by mouth daily for 6 days.   cetirizine 10 MG tablet Commonly known as: ZYRTEC Take 10 mg by mouth daily.   CINNAMON PO Take 1 tablet by mouth in the morning and at bedtime.   irbesartan 300 MG tablet Commonly known as: AVAPRO Take 300 mg by mouth daily.   Jardiance 25 MG Tabs tablet Generic drug: empagliflozin Take by mouth daily.  metFORMIN 500 MG tablet Commonly known as: GLUCOPHAGE Take 1,000 mg by mouth daily with breakfast.   metFORMIN 500 MG tablet Commonly known as: GLUCOPHAGE Take 500 mg by mouth at bedtime.   metoprolol tartrate 50 MG tablet Commonly known as: LOPRESSOR Take 50 mg by mouth 2 (two) times daily.   tamsulosin 0.4 MG Caps capsule Commonly known as: FLOMAX Take 0.4 mg by mouth. 2 tablets in the evening.   TRESIBA [place] Inject 46 Units into the skin daily.        Followup:   Follow-up Information     [facility] Follow up on 09/25/2022.   Why: 9:15am Contact information: [place] [phone]

## 2022-09-26 DIAGNOSIS — H353121 Nonexudative age-related macular degeneration, left eye, early dry stage: Secondary | ICD-10-CM | POA: Diagnosis not present

## 2022-09-26 DIAGNOSIS — E119 Type 2 diabetes mellitus without complications: Secondary | ICD-10-CM | POA: Diagnosis not present

## 2022-09-26 DIAGNOSIS — H52203 Unspecified astigmatism, bilateral: Secondary | ICD-10-CM | POA: Diagnosis not present

## 2022-09-26 DIAGNOSIS — Z961 Presence of intraocular lens: Secondary | ICD-10-CM | POA: Diagnosis not present

## 2022-10-03 DIAGNOSIS — N3941 Urge incontinence: Secondary | ICD-10-CM | POA: Diagnosis not present

## 2022-10-09 DIAGNOSIS — R7989 Other specified abnormal findings of blood chemistry: Secondary | ICD-10-CM | POA: Diagnosis not present

## 2022-10-09 DIAGNOSIS — E1129 Type 2 diabetes mellitus with other diabetic kidney complication: Secondary | ICD-10-CM | POA: Diagnosis not present

## 2022-10-09 DIAGNOSIS — E7800 Pure hypercholesterolemia, unspecified: Secondary | ICD-10-CM | POA: Diagnosis not present

## 2022-10-09 DIAGNOSIS — Z125 Encounter for screening for malignant neoplasm of prostate: Secondary | ICD-10-CM | POA: Diagnosis not present

## 2022-10-09 DIAGNOSIS — N1832 Chronic kidney disease, stage 3b: Secondary | ICD-10-CM | POA: Diagnosis not present

## 2022-10-17 DIAGNOSIS — Z1331 Encounter for screening for depression: Secondary | ICD-10-CM | POA: Diagnosis not present

## 2022-10-17 DIAGNOSIS — I2510 Atherosclerotic heart disease of native coronary artery without angina pectoris: Secondary | ICD-10-CM | POA: Diagnosis not present

## 2022-10-17 DIAGNOSIS — E669 Obesity, unspecified: Secondary | ICD-10-CM | POA: Diagnosis not present

## 2022-10-17 DIAGNOSIS — H9193 Unspecified hearing loss, bilateral: Secondary | ICD-10-CM | POA: Diagnosis not present

## 2022-10-17 DIAGNOSIS — N1832 Chronic kidney disease, stage 3b: Secondary | ICD-10-CM | POA: Diagnosis not present

## 2022-10-17 DIAGNOSIS — E1129 Type 2 diabetes mellitus with other diabetic kidney complication: Secondary | ICD-10-CM | POA: Diagnosis not present

## 2022-10-17 DIAGNOSIS — I1310 Hypertensive heart and chronic kidney disease without heart failure, with stage 1 through stage 4 chronic kidney disease, or unspecified chronic kidney disease: Secondary | ICD-10-CM | POA: Diagnosis not present

## 2022-10-17 DIAGNOSIS — Z955 Presence of coronary angioplasty implant and graft: Secondary | ICD-10-CM | POA: Diagnosis not present

## 2022-10-17 DIAGNOSIS — E1151 Type 2 diabetes mellitus with diabetic peripheral angiopathy without gangrene: Secondary | ICD-10-CM | POA: Diagnosis not present

## 2022-10-17 DIAGNOSIS — D692 Other nonthrombocytopenic purpura: Secondary | ICD-10-CM | POA: Diagnosis not present

## 2022-10-17 DIAGNOSIS — Z0000 Encounter for general adult medical examination without abnormal findings: Secondary | ICD-10-CM | POA: Diagnosis not present

## 2022-10-17 DIAGNOSIS — R82998 Other abnormal findings in urine: Secondary | ICD-10-CM | POA: Diagnosis not present

## 2022-10-17 DIAGNOSIS — Z1339 Encounter for screening examination for other mental health and behavioral disorders: Secondary | ICD-10-CM | POA: Diagnosis not present

## 2022-10-17 DIAGNOSIS — E7800 Pure hypercholesterolemia, unspecified: Secondary | ICD-10-CM | POA: Diagnosis not present

## 2022-10-17 DIAGNOSIS — Z794 Long term (current) use of insulin: Secondary | ICD-10-CM | POA: Diagnosis not present

## 2022-10-22 DIAGNOSIS — R8271 Bacteriuria: Secondary | ICD-10-CM | POA: Diagnosis not present

## 2022-10-22 DIAGNOSIS — R3914 Feeling of incomplete bladder emptying: Secondary | ICD-10-CM | POA: Diagnosis not present

## 2022-10-22 DIAGNOSIS — R3915 Urgency of urination: Secondary | ICD-10-CM | POA: Diagnosis not present

## 2023-01-02 DIAGNOSIS — R3914 Feeling of incomplete bladder emptying: Secondary | ICD-10-CM | POA: Diagnosis not present

## 2023-01-02 DIAGNOSIS — R3915 Urgency of urination: Secondary | ICD-10-CM | POA: Diagnosis not present

## 2023-01-02 DIAGNOSIS — N3943 Post-void dribbling: Secondary | ICD-10-CM | POA: Diagnosis not present

## 2023-01-02 DIAGNOSIS — N401 Enlarged prostate with lower urinary tract symptoms: Secondary | ICD-10-CM | POA: Diagnosis not present

## 2023-04-24 DIAGNOSIS — E7800 Pure hypercholesterolemia, unspecified: Secondary | ICD-10-CM | POA: Diagnosis not present

## 2023-04-24 DIAGNOSIS — N1832 Chronic kidney disease, stage 3b: Secondary | ICD-10-CM | POA: Diagnosis not present

## 2023-04-24 DIAGNOSIS — E1151 Type 2 diabetes mellitus with diabetic peripheral angiopathy without gangrene: Secondary | ICD-10-CM | POA: Diagnosis not present

## 2023-04-24 DIAGNOSIS — Z955 Presence of coronary angioplasty implant and graft: Secondary | ICD-10-CM | POA: Diagnosis not present

## 2023-04-24 DIAGNOSIS — D692 Other nonthrombocytopenic purpura: Secondary | ICD-10-CM | POA: Diagnosis not present

## 2023-04-24 DIAGNOSIS — E669 Obesity, unspecified: Secondary | ICD-10-CM | POA: Diagnosis not present

## 2023-04-24 DIAGNOSIS — E1129 Type 2 diabetes mellitus with other diabetic kidney complication: Secondary | ICD-10-CM | POA: Diagnosis not present

## 2023-04-24 DIAGNOSIS — I2510 Atherosclerotic heart disease of native coronary artery without angina pectoris: Secondary | ICD-10-CM | POA: Diagnosis not present

## 2023-04-24 DIAGNOSIS — I1310 Hypertensive heart and chronic kidney disease without heart failure, with stage 1 through stage 4 chronic kidney disease, or unspecified chronic kidney disease: Secondary | ICD-10-CM | POA: Diagnosis not present

## 2023-04-24 DIAGNOSIS — Z79899 Other long term (current) drug therapy: Secondary | ICD-10-CM | POA: Diagnosis not present

## 2023-04-24 DIAGNOSIS — Z794 Long term (current) use of insulin: Secondary | ICD-10-CM | POA: Diagnosis not present

## 2023-04-24 DIAGNOSIS — I209 Angina pectoris, unspecified: Secondary | ICD-10-CM | POA: Diagnosis not present

## 2023-07-10 DIAGNOSIS — N3943 Post-void dribbling: Secondary | ICD-10-CM | POA: Diagnosis not present

## 2023-07-10 DIAGNOSIS — N401 Enlarged prostate with lower urinary tract symptoms: Secondary | ICD-10-CM | POA: Diagnosis not present

## 2023-07-10 DIAGNOSIS — N5201 Erectile dysfunction due to arterial insufficiency: Secondary | ICD-10-CM | POA: Diagnosis not present

## 2023-07-10 DIAGNOSIS — R3914 Feeling of incomplete bladder emptying: Secondary | ICD-10-CM | POA: Diagnosis not present

## 2023-07-17 DIAGNOSIS — L720 Epidermal cyst: Secondary | ICD-10-CM | POA: Diagnosis not present

## 2023-07-17 DIAGNOSIS — L570 Actinic keratosis: Secondary | ICD-10-CM | POA: Diagnosis not present

## 2023-07-17 DIAGNOSIS — L814 Other melanin hyperpigmentation: Secondary | ICD-10-CM | POA: Diagnosis not present

## 2023-07-17 DIAGNOSIS — D692 Other nonthrombocytopenic purpura: Secondary | ICD-10-CM | POA: Diagnosis not present

## 2023-07-17 DIAGNOSIS — L821 Other seborrheic keratosis: Secondary | ICD-10-CM | POA: Diagnosis not present

## 2023-07-17 DIAGNOSIS — Z85828 Personal history of other malignant neoplasm of skin: Secondary | ICD-10-CM | POA: Diagnosis not present

## 2023-08-28 DIAGNOSIS — R058 Other specified cough: Secondary | ICD-10-CM | POA: Diagnosis not present

## 2023-08-28 DIAGNOSIS — N1832 Chronic kidney disease, stage 3b: Secondary | ICD-10-CM | POA: Diagnosis not present

## 2023-08-28 DIAGNOSIS — E1129 Type 2 diabetes mellitus with other diabetic kidney complication: Secondary | ICD-10-CM | POA: Diagnosis not present

## 2023-08-28 DIAGNOSIS — J189 Pneumonia, unspecified organism: Secondary | ICD-10-CM | POA: Diagnosis not present

## 2023-08-28 DIAGNOSIS — J90 Pleural effusion, not elsewhere classified: Secondary | ICD-10-CM | POA: Diagnosis not present

## 2023-08-28 DIAGNOSIS — J029 Acute pharyngitis, unspecified: Secondary | ICD-10-CM | POA: Diagnosis not present

## 2023-08-28 DIAGNOSIS — R0981 Nasal congestion: Secondary | ICD-10-CM | POA: Diagnosis not present

## 2023-08-28 DIAGNOSIS — Z1152 Encounter for screening for COVID-19: Secondary | ICD-10-CM | POA: Diagnosis not present

## 2023-08-30 DIAGNOSIS — I469 Cardiac arrest, cause unspecified: Secondary | ICD-10-CM | POA: Diagnosis not present

## 2023-08-30 DIAGNOSIS — I499 Cardiac arrhythmia, unspecified: Secondary | ICD-10-CM | POA: Diagnosis not present

## 2023-08-30 DIAGNOSIS — R739 Hyperglycemia, unspecified: Secondary | ICD-10-CM | POA: Diagnosis not present

## 2023-08-30 DIAGNOSIS — R404 Transient alteration of awareness: Secondary | ICD-10-CM | POA: Diagnosis not present

## 2023-08-30 DIAGNOSIS — R55 Syncope and collapse: Secondary | ICD-10-CM | POA: Diagnosis not present

## 2023-09-25 DEATH — deceased
